# Patient Record
Sex: Female | Born: 2000 | Race: Black or African American | Hispanic: No | Marital: Single | Smoking: Former smoker
Health system: Southern US, Community
[De-identification: ages and names within clinical notes are randomized; demographics above are authoritative.]

## PROBLEM LIST (undated history)

## (undated) DIAGNOSIS — B999 Unspecified infectious disease: Secondary | ICD-10-CM

## (undated) DIAGNOSIS — F329 Major depressive disorder, single episode, unspecified: Secondary | ICD-10-CM

## (undated) DIAGNOSIS — J45909 Unspecified asthma, uncomplicated: Secondary | ICD-10-CM

## (undated) DIAGNOSIS — F32A Depression, unspecified: Secondary | ICD-10-CM

## (undated) HISTORY — DX: Depression, unspecified: F32.A

## (undated) HISTORY — PX: NO PAST SURGERIES: SHX2092

---
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
## 2018-12-03 ENCOUNTER — Inpatient Hospital Stay (HOSPITAL_COMMUNITY)
Admission: AD | Admit: 2018-12-03 | Discharge: 2018-12-03 | Disposition: A | Discharge status: Home / Self Care | Payer: Medicaid Other | Attending: Obstetrics and Gynecology | Admitting: Obstetrics and Gynecology

## 2018-12-03 ENCOUNTER — Inpatient Hospital Stay (HOSPITAL_COMMUNITY): Payer: Medicaid Other

## 2018-12-03 ENCOUNTER — Encounter (HOSPITAL_COMMUNITY): Payer: Self-pay | Admitting: *Deleted

## 2018-12-03 ENCOUNTER — Other Ambulatory Visit: Payer: Self-pay

## 2018-12-03 DIAGNOSIS — O99511 Diseases of the respiratory system complicating pregnancy, first trimester: Secondary | ICD-10-CM | POA: Insufficient documentation

## 2018-12-03 DIAGNOSIS — O26892 Other specified pregnancy related conditions, second trimester: Secondary | ICD-10-CM | POA: Diagnosis present

## 2018-12-03 DIAGNOSIS — J45909 Unspecified asthma, uncomplicated: Secondary | ICD-10-CM | POA: Diagnosis not present

## 2018-12-03 DIAGNOSIS — Z3491 Encounter for supervision of normal pregnancy, unspecified, first trimester: Secondary | ICD-10-CM

## 2018-12-03 DIAGNOSIS — N760 Acute vaginitis: Secondary | ICD-10-CM | POA: Diagnosis present

## 2018-12-03 DIAGNOSIS — B9689 Other specified bacterial agents as the cause of diseases classified elsewhere: Secondary | ICD-10-CM | POA: Diagnosis not present

## 2018-12-03 DIAGNOSIS — Z3A10 10 weeks gestation of pregnancy: Secondary | ICD-10-CM | POA: Diagnosis not present

## 2018-12-03 DIAGNOSIS — R109 Unspecified abdominal pain: Secondary | ICD-10-CM | POA: Insufficient documentation

## 2018-12-03 DIAGNOSIS — O26891 Other specified pregnancy related conditions, first trimester: Secondary | ICD-10-CM

## 2018-12-03 DIAGNOSIS — O210 Mild hyperemesis gravidarum: Secondary | ICD-10-CM | POA: Diagnosis not present

## 2018-12-03 DIAGNOSIS — O3680X0 Pregnancy with inconclusive fetal viability, not applicable or unspecified: Secondary | ICD-10-CM | POA: Diagnosis not present

## 2018-12-03 DIAGNOSIS — Z87891 Personal history of nicotine dependence: Secondary | ICD-10-CM | POA: Diagnosis not present

## 2018-12-03 DIAGNOSIS — Z3A01 Less than 8 weeks gestation of pregnancy: Secondary | ICD-10-CM

## 2018-12-03 DIAGNOSIS — Z3490 Encounter for supervision of normal pregnancy, unspecified, unspecified trimester: Secondary | ICD-10-CM

## 2018-12-03 DIAGNOSIS — O23591 Infection of other part of genital tract in pregnancy, first trimester: Secondary | ICD-10-CM | POA: Diagnosis not present

## 2018-12-03 HISTORY — DX: Unspecified asthma, uncomplicated: J45.909

## 2018-12-03 LAB — CBC
HCT: 40.9 % (ref 36.0–46.0)
Hemoglobin: 14.1 g/dL (ref 12.0–15.0)
MCH: 30.6 pg (ref 26.0–34.0)
MCHC: 34.5 g/dL (ref 30.0–36.0)
MCV: 88.7 fL (ref 80.0–100.0)
Platelets: 281 10*3/uL (ref 150–400)
RBC: 4.61 MIL/uL (ref 3.87–5.11)
RDW: 12.5 % (ref 11.5–15.5)
WBC: 7.1 10*3/uL (ref 4.0–10.5)
nRBC: 0 % (ref 0.0–0.2)

## 2018-12-03 LAB — ABO/RH: ABO/RH(D): A NEG

## 2018-12-03 LAB — URINALYSIS, ROUTINE W REFLEX MICROSCOPIC
Bilirubin Urine: NEGATIVE
Glucose, UA: NEGATIVE mg/dL
Hgb urine dipstick: NEGATIVE
Ketones, ur: NEGATIVE mg/dL
Leukocytes,Ua: NEGATIVE
Nitrite: NEGATIVE
Protein, ur: NEGATIVE mg/dL
Specific Gravity, Urine: 1.004 — ABNORMAL LOW (ref 1.005–1.030)
pH: 6 (ref 5.0–8.0)

## 2018-12-03 LAB — WET PREP, GENITAL
Sperm: NONE SEEN
Trich, Wet Prep: NONE SEEN
Yeast Wet Prep HPF POC: NONE SEEN

## 2018-12-03 LAB — TYPE AND SCREEN
ABO/RH(D): A NEG
Antibody Screen: NEGATIVE

## 2018-12-03 LAB — HCG, QUANTITATIVE, PREGNANCY: hCG, Beta Chain, Quant, S: 2275 m[IU]/mL — ABNORMAL HIGH (ref <5)

## 2018-12-03 MED ORDER — PRENATAL ADULT GUMMY/DHA/FA 0.4-25 MG PO CHEW: 1.0000 | CHEWABLE_TABLET | Freq: Every day | ORAL | 12 refills | Status: AC

## 2018-12-03 MED ORDER — OMEPRAZOLE 20 MG PO CPDR: 20.0000 mg | DELAYED_RELEASE_CAPSULE | Freq: Every day | ORAL | 3 refills | Status: DC

## 2018-12-03 MED ORDER — PROMETHAZINE HCL 25 MG PO TABS: 25.0000 mg | ORAL_TABLET | Freq: Four times a day (QID) | ORAL | 3 refills | Status: DC | PRN

## 2018-12-03 MED ORDER — METRONIDAZOLE 0.75 % VA GEL: 1.0000 | Freq: Every day | VAGINAL | 0 refills | Status: AC

## 2018-12-03 NOTE — MAU Provider Note (Signed)
History     CSN: [id number]  Arrival date and time: 12/03/18 1008   First Provider Initiated Contact with Patient 12/03/18 1122      Chief Complaint  Patient presents with  . Abdominal Pain   HPI  Ms.  Emily Welch is a 18 y.o. year old G1P0 female at 269w2d weeks gestation by LMP who presents to MAU reporting mild abdominal cramping that started 11/18/18. She reports the cramping was "stronger when it started" on 11/18/18, but is now mild. She had (+) HPT x 2 and (+) UPT at [facility] (has paperwork with her today) all on 11/30/2018. She does report some mild N/V.  She does not have a regular GYN and [facility]; requesting a list.  Past Medical History:  Diagnosis Date  . Asthma     History reviewed. No pertinent surgical history.  History reviewed. No pertinent family history.  Social History   Tobacco Use  . Smoking status: Former Games developermoker  . Smokeless tobacco: Former Engineer, waterUser  Substance Use Topics  . Alcohol use: Not Currently  . Drug use: Not Currently    Types: Marijuana    Comment: stopped when found out she was pregnant    Allergies: No Known Allergies  No medications prior to admission.    Review of Systems  Constitutional: Negative.   HENT: Negative.   Eyes: Negative.   Respiratory: Negative.   Cardiovascular: Negative.   Gastrointestinal: Positive for nausea.  Endocrine: Negative.   Genitourinary: Positive for pelvic pain (cramping).  Musculoskeletal: Negative.   Skin: Negative.   Allergic/Immunologic: Negative.   Neurological: Negative.   Hematological: Negative.   Psychiatric/Behavioral: Negative.    Physical Exam   Blood pressure 124/61, pulse 83, temperature 98.3 F (36.8 C), temperature source Oral, resp. rate 17, height 5' (1.524 m), weight 65.7 kg, last menstrual period 10/20/2018, SpO2 100 %.  Physical Exam  Nursing note and vitals reviewed. Constitutional: She is oriented to person, place, and time. She  appears well-developed and well-nourished.  HENT:  Head: Normocephalic and atraumatic.  Eyes: Pupils are equal, round, and reactive to light.  Neck: Normal range of motion.  Cardiovascular: Normal rate.  Respiratory: Effort normal.  GI: Soft.  Genitourinary:    Genitourinary Comments: Uterus: non-tender, SE: cervix is smooth, pink, no lesions, small amt of thick, white vaginal d/c -- WP, GC/CT done, closed/long/firm, no CMT or friability, no adnexal tenderness    Musculoskeletal: Normal range of motion.  Neurological: She is alert and oriented to person, place, and time. She has normal reflexes.  Skin: Skin is warm and dry.  Psychiatric: She has a normal mood and affect. Her behavior is normal. Judgment and thought content normal.    MAU Course  Procedures  MDM CCUA UPT CBC ABO/Rh HCG Wet Prep GC/CT -- pending HIV -- pending OB < 14 wks US with TV  Results for orders placed or performed during the hospital encounter of 12/03/18 (from the past 24 hour(s))  CBC     Status: None   Collection Time: 12/03/18 10:33 AM  Result Value Ref Range   WBC 7.1 4.0 - 10.5 K/uL   RBC 4.61 3.87 - 5.11 MIL/uL   Hemoglobin 14.1 12.0 - 15.0 g/dL   HCT 78.240.9 95.636.0 - 21.346.0 %   MCV 88.7 80.0 - 100.0 fL   MCH 30.6 26.0 - 34.0 pg   MCHC 34.5 30.0 - 36.0 g/dL   RDW 08.612.5 57.811.5 - 46.915.5 %  Platelets 281 150 - 400 K/uL   nRBC 0.0 0.0 - 0.2 %  Type and screen Tok     Status: None   Collection Time: 12/03/18 10:33 AM  Result Value Ref Range   ABO/RH(D) A NEG    Antibody Screen NEG    Sample Expiration      12/06/2018,2359 Performed at [facility], [place] [address], [place], [place] [address]   hCG, quantitative, pregnancy     Status: Abnormal   Collection Time: 12/03/18 10:33 AM  Result Value Ref Range   hCG, Beta Chain, Quant, S 2,275 (H) <5 mIU/mL  Urinalysis, Routine w reflex microscopic     Status: Abnormal   Collection Time: 12/03/18 10:35 AM  Result Value Ref  Range   Color, Urine STRAW (A) YELLOW   APPearance CLEAR CLEAR   Specific Gravity, Urine 1.004 (L) 1.005 - 1.030   pH 6.0 5.0 - 8.0   Glucose, UA NEGATIVE NEGATIVE mg/dL   Hgb urine dipstick NEGATIVE NEGATIVE   Bilirubin Urine NEGATIVE NEGATIVE   Ketones, ur NEGATIVE NEGATIVE mg/dL   Protein, ur NEGATIVE NEGATIVE mg/dL   Nitrite NEGATIVE NEGATIVE   Leukocytes,Ua NEGATIVE NEGATIVE  ABO/Rh     Status: None   Collection Time: 12/03/18 10:35 AM  Result Value Ref Range   ABO/RH(D)      A NEG Performed at [facility] [address], [place],  [address]   Wet prep, genital     Status: Abnormal   Collection Time: 12/03/18 11:44 AM  Result Value Ref Range   Yeast Wet Prep HPF POC NONE SEEN NONE SEEN   Trich, Wet Prep NONE SEEN NONE SEEN   Clue Cells Wet Prep HPF POC PRESENT (A) NONE SEEN   WBC, Wet Prep HPF POC FEW (A) NONE SEEN   Sperm NONE SEEN     US Ob Less Than 14 Weeks With Ob Transvaginal  Result Date: 12/03/2018 CLINICAL DATA:  Abdominal pain during pregnancy in first trimester. EXAM: OBSTETRIC <14 WK [place] AND TRANSVAGINAL OB US TECHNIQUE: Both transabdominal and transvaginal ultrasound examinations were performed for complete evaluation of the gestation as well as the maternal uterus, adnexal regions, and pelvic cul-de-sac. Transvaginal technique was performed to assess early pregnancy. COMPARISON:  None. FINDINGS: Intrauterine gestational sac: Single Yolk sac:  Visualized. Embryo:  Not Visualized. Cardiac Activity: Not Visualized. MSD: 5.09 mm   5 w   2 d Subchorionic hemorrhage:  None visualized. Maternal uterus/adnexae: Probable corpus luteum cyst is seen in left ovary. No free fluid is noted. IMPRESSION: Probable early intrauterine gestational sac with yolk sac, but no fetal pole or cardiac activity yet visualized. Recommend follow-up quantitative B-HCG levels and follow-up US in 14 days to assess viability. This recommendation follows SRU consensus guidelines:  Diagnostic Criteria for Nonviable Pregnancy Early in the First Trimester. [place] [employer] Med 2013; 564:3329-51. Electronically Signed   By: Marijo Conception M.D.   On: 12/03/2018 13:03     Assessment and Plan  1) Intrauterine pregnancy - Advised to start [facility] @ [redacted] wks gestation - Information for [employer] given - Information provided on 1st trimester pregnancy - Rx for PNV chewable po daily  2) Abdominal pain during pregnancy in first trimester - Information provided on abdominal pain in pregnancy and safe medication in pregnancy - Advised to take Tylenol 1000 mg every 6 hours prn pain  3) Pregnancy with uncertain fetal viability, single or unspecified fetus   -  F/U ultrasound for viability in 2 wks at [facility] - Reassurance given that baby may not have been seen d/t very young gestation  4) Morning sickness  - Information provided on morning sickness - Rx for Phenergan 25 mg every 6 hours prn   5) Bacterial vaginosis  - Information provided on BV - Rx for Metrogel 1 applicatorful hs x 5 days  - Discharge patient - Anticipate a call to schedule U/S in 2 wks - Message sent to [facility] and [facility] to get patient scheduled accordingly - Patient verbalized an understanding of the plan of care and agrees.     Raelyn Mora, MSN, CNM 12/03/2018, 11:22 AM

## 2018-12-03 NOTE — MAU Note (Signed)
Just having mild cramping. +HPT x2. Has been to [facility] on Friday for confirmation. Has paperwork with her.

## 2018-12-03 NOTE — Discharge Instructions (Signed)

## 2018-12-03 NOTE — MAU Note (Signed)
Came out of rm, states getting pains in LUQ now.

## 2018-12-04 LAB — GC/CHLAMYDIA PROBE AMP (~~LOC~~) NOT AT ARMC
Chlamydia: POSITIVE — AB
Comment: NEGATIVE
Comment: NORMAL
Neisseria Gonorrhea: NEGATIVE

## 2018-12-05 ENCOUNTER — Telehealth: Payer: Self-pay | Admitting: Obstetrics and Gynecology

## 2018-12-05 ENCOUNTER — Encounter: Payer: Self-pay | Admitting: *Deleted

## 2018-12-05 ENCOUNTER — Other Ambulatory Visit: Payer: Self-pay | Admitting: Obstetrics and Gynecology

## 2018-12-05 DIAGNOSIS — O98311 Other infections with a predominantly sexual mode of transmission complicating pregnancy, first trimester: Secondary | ICD-10-CM

## 2018-12-05 DIAGNOSIS — A568 Sexually transmitted chlamydial infection of other sites: Secondary | ICD-10-CM

## 2018-12-05 MED ORDER — AZITHROMYCIN 500 MG PO TABS: 1000.0000 mg | ORAL_TABLET | Freq: Once | ORAL | 1 refills | Status: AC

## 2018-12-05 NOTE — Progress Notes (Signed)
Received STD report and noted Emily Welch + chlamydia. Per chart was notified and treated by Rolitta Dawson,CNM. STD report completed and faxed to health department. Linda,RN

## 2018-12-05 NOTE — Telephone Encounter (Signed)
TC to patient to notify her of (+) CT results and the need for abx Rx for her partner will be sent to pharmacy on file. Emphasized the importance of taking the abx and getting treated early to prevent complications (as severe a pregnancy loss) from occurring. Patient verbalized an understanding of the plan of care and agrees.   Laury Deep, CNM

## 2018-12-05 NOTE — Progress Notes (Signed)
Rx for Azithromycin 1000 mg po with 1 refill for expedited partner treatment. Patient made aware of the purpose for the refill. Also notified to abstain from unprotected sex with partner until 7-14 days after both of them have been treated. Patient verbalized an understanding of the plan of care and agrees.   Laury Deep, CNM

## 2018-12-06 ENCOUNTER — Inpatient Hospital Stay: Admission: RE | Admit: 2018-12-06 | Payer: Medicaid Other | Source: Ambulatory Visit

## 2018-12-10 ENCOUNTER — Telehealth: Payer: Self-pay | Admitting: General Practice

## 2018-12-10 NOTE — Telephone Encounter (Signed)
Patient called spoke with after hours team stating that she need resources in regards to mental health issues.  Called patient, but no answer.  Left message on VM with Behavioral Health Outpatient service number [phone]).  Asked patient to give our office a call with any questions.

## 2018-12-17 ENCOUNTER — Ambulatory Visit (HOSPITAL_COMMUNITY)
Admission: RE | Admit: 2018-12-17 | Discharge: 2018-12-17 | Disposition: A | Discharge status: Home / Self Care | Payer: Medicaid Other | Source: Ambulatory Visit | Attending: Obstetrics and Gynecology | Admitting: Obstetrics and Gynecology

## 2018-12-17 ENCOUNTER — Other Ambulatory Visit: Payer: Self-pay

## 2018-12-17 ENCOUNTER — Ambulatory Visit (INDEPENDENT_AMBULATORY_CARE_PROVIDER_SITE_OTHER): Payer: Medicaid Other

## 2018-12-17 ENCOUNTER — Encounter: Payer: Self-pay | Admitting: Family Medicine

## 2018-12-17 DIAGNOSIS — O3680X0 Pregnancy with inconclusive fetal viability, not applicable or unspecified: Secondary | ICD-10-CM | POA: Diagnosis not present

## 2018-12-17 NOTE — Progress Notes (Signed)
Chart reviewed - agree with RN documentation.   

## 2018-12-17 NOTE — Progress Notes (Signed)
Pt here today for OB US results.  Dr. Nehemiah Settle notified.  Provider recommendation to start OB care.   Pt notified that she has a viable pregnancy with EDD 08/06/2019, she is 6w 6d today, and FHR 138 bpm.  Pt denies vaginal bleeding but is having some menstrual like cramps.  I explained to the pt that it is normal.  List of medicines safe to take in pregnancy given to pt.  Proof of pregnancy letter provided by the front office.  Frances Nickels 12/17/18

## 2019-01-08 ENCOUNTER — Other Ambulatory Visit: Payer: Self-pay

## 2019-01-08 ENCOUNTER — Ambulatory Visit (INDEPENDENT_AMBULATORY_CARE_PROVIDER_SITE_OTHER): Payer: Medicaid Other | Admitting: *Deleted

## 2019-01-08 VITALS — Wt 138.0 lb

## 2019-01-08 DIAGNOSIS — Z3401 Encounter for supervision of normal first pregnancy, first trimester: Secondary | ICD-10-CM

## 2019-01-08 DIAGNOSIS — Z3400 Encounter for supervision of normal first pregnancy, unspecified trimester: Secondary | ICD-10-CM

## 2019-01-08 DIAGNOSIS — O0990 Supervision of high risk pregnancy, unspecified, unspecified trimester: Secondary | ICD-10-CM | POA: Diagnosis not present

## 2019-01-08 DIAGNOSIS — Z3A10 10 weeks gestation of pregnancy: Secondary | ICD-10-CM

## 2019-01-08 MED ORDER — FAMOTIDINE 20 MG PO TABS: 20.0000 mg | ORAL_TABLET | Freq: Two times a day (BID) | ORAL | 1 refills | Status: DC

## 2019-01-08 MED ORDER — BLOOD PRESSURE MONITOR AUTOMAT DEVI: 1.0000 | Freq: Every day | 0 refills | Status: AC

## 2019-01-08 MED ORDER — DICLEGIS 10-10 MG PO TBEC: 1.0000 | DELAYED_RELEASE_TABLET | Freq: Every evening | ORAL | 2 refills | Status: DC | PRN

## 2019-01-08 NOTE — Progress Notes (Signed)
  Virtual Visit via Telephone Note  I connected with Emily Welch on 01/08/19 at  1:30 PM EST by telephone and verified that I am speaking with the correct person using two identifiers.  Location: Patient: Emily Welch MRN: [id number] Provider: Derl Barrow, RN   I discussed the limitations, risks, security and privacy concerns of performing an evaluation and management service by telephone and the availability of in person appointments. I also discussed with the patient that there may be a patient responsible charge related to this service. The patient expressed understanding and agreed to proceed.   History of Present Illness: PRENATAL INTAKE SUMMARY  Emily Welch presents today New OB Nurse Interview.  OB History    Gravida  1   Para      Term      Preterm      AB      Living        SAB      TAB      Ectopic      Multiple      Live Births             I have reviewed the patient's medical, obstetrical, social, and family histories, medications, and available lab results.  SUBJECTIVE She complains of nausea and heart burn.   Observations/Objective: Initial nurse interview for history/labs (New OB)  EDD: 08/06/2019 by early ultrasound GA: [redacted]w[redacted]d G1P0 FHT: non face to face interview  GENERAL APPEARANCE: non face to face interview  Assessment and Plan: Normal pregnancy Prenatal care-[facility] Labs to be completed at next visit with midwife Rx for Pepcid 20 mg and Diglegis sent to pharmacy Rx for blood pressure cuff sent to pharmacy Continue PNV Sign up for Babyscripts TOC 02/01/2019  Follow Up Instructions:   I discussed the assessment and treatment plan with the patient. The patient was provided an opportunity to ask questions and all were answered. The patient agreed with the plan and demonstrated an understanding of the instructions.   The patient was advised to call back or seek an in-person evaluation if the symptoms worsen or  if the condition fails to improve as anticipated.  I provided 15 minutes of non-face-to-face time during this encounter.   Derl Barrow, RN

## 2019-01-21 ENCOUNTER — Telehealth: Payer: Self-pay | Admitting: Emergency Medicine

## 2019-01-21 ENCOUNTER — Telehealth: Payer: Medicaid Other

## 2019-01-21 NOTE — Telephone Encounter (Signed)
Called pt regarding VV today at 6pm, pt states she had all her questions answered by her PCP.

## 2019-01-21 NOTE — Telephone Encounter (Signed)
Chart review.

## 2019-01-22 DIAGNOSIS — O26891 Other specified pregnancy related conditions, first trimester: Secondary | ICD-10-CM | POA: Diagnosis not present

## 2019-01-22 DIAGNOSIS — O200 Threatened abortion: Secondary | ICD-10-CM | POA: Diagnosis not present

## 2019-01-22 DIAGNOSIS — Z3A12 12 weeks gestation of pregnancy: Secondary | ICD-10-CM | POA: Diagnosis not present

## 2019-01-22 DIAGNOSIS — O0990 Supervision of high risk pregnancy, unspecified, unspecified trimester: Secondary | ICD-10-CM | POA: Diagnosis not present

## 2019-02-01 ENCOUNTER — Encounter: Payer: Self-pay | Admitting: General Practice

## 2019-02-01 ENCOUNTER — Other Ambulatory Visit: Payer: Self-pay

## 2019-02-01 ENCOUNTER — Other Ambulatory Visit (HOSPITAL_COMMUNITY)
Admission: RE | Admit: 2019-02-01 | Discharge: 2019-02-01 | Disposition: A | Discharge status: Home / Self Care | Payer: Medicaid Other | Source: Ambulatory Visit

## 2019-02-01 ENCOUNTER — Ambulatory Visit (INDEPENDENT_AMBULATORY_CARE_PROVIDER_SITE_OTHER): Payer: Medicaid Other

## 2019-02-01 VITALS — BP 115/75 | HR 90 | Temp 98.1°F | Wt 138.2 lb

## 2019-02-01 DIAGNOSIS — O210 Mild hyperemesis gravidarum: Secondary | ICD-10-CM | POA: Diagnosis not present

## 2019-02-01 DIAGNOSIS — O98311 Other infections with a predominantly sexual mode of transmission complicating pregnancy, first trimester: Secondary | ICD-10-CM

## 2019-02-01 DIAGNOSIS — A568 Sexually transmitted chlamydial infection of other sites: Secondary | ICD-10-CM

## 2019-02-01 DIAGNOSIS — Z23 Encounter for immunization: Secondary | ICD-10-CM | POA: Diagnosis not present

## 2019-02-01 DIAGNOSIS — Z3400 Encounter for supervision of normal first pregnancy, unspecified trimester: Secondary | ICD-10-CM

## 2019-02-01 DIAGNOSIS — Z3482 Encounter for supervision of other normal pregnancy, second trimester: Secondary | ICD-10-CM | POA: Diagnosis not present

## 2019-02-01 DIAGNOSIS — F1290 Cannabis use, unspecified, uncomplicated: Secondary | ICD-10-CM

## 2019-02-01 DIAGNOSIS — Z3A13 13 weeks gestation of pregnancy: Secondary | ICD-10-CM

## 2019-02-01 DIAGNOSIS — O99321 Drug use complicating pregnancy, first trimester: Secondary | ICD-10-CM

## 2019-02-01 NOTE — Patient Instructions (Signed)

## 2019-02-01 NOTE — Progress Notes (Signed)
Subjective:   Emily Welch is a 19 y.o. G1P0 at [redacted]w[redacted]d by early ultrasound being seen today for her first obstetrical visit.  She has no obstetrical history, however has a medical history significant for asthma. Patient does intend to breast feed. Pregnancy history fully reviewed. She desires a Nexplanon for birth control and has used this in the past.   Emily Welch states the FOB, Emily Welch, is aware and involved in the pregnancy.  She endorses a history of CT and was treated.  However, she is unsure of whether or not her partner was treated stating that when she asked he told her he had to reschedule his appt.   Patient reports headache.  She endorses a history of migraines and states her headaches are now "more intense."  She does not know how often she is having the headaches and states she does not take medication when they occur.  Patient does report some nausea in conjunction with her headaches.  She reports that sleeping causes resolution of the headaches.   Patient denies issues with urination, but reports some constipation.  She states she had a bowel movement 2 days ago that was not hard to pass.  She denies sexual activity in the past 3 days. She does report some white discharge and states she did not complete her treatment for BV back in November. She denies odor or vaginal bleeding.   Patient endorses a history of marijuana usage and states she used 2 weeks ago.     HISTORY: OB History  Gravida Para Term Preterm AB Living  1 0 0 0 0 0  SAB TAB Ectopic Multiple Live Births  0 0 0 0 0    # Outcome Date GA Lbr Len/2nd Weight Sex Delivery Anes PTL Lv  1 Current             No pap smear history d/t age.  Past Medical History:  Diagnosis Date  . Asthma   . Depression    Past Surgical History:  Procedure Laterality Date  . NO PAST SURGERIES     Family History  Problem Relation Age of Onset  . Heart disease Father   . Diabetes Sister    Social History   Tobacco  Use  . Smoking status: Former Games developer  . Smokeless tobacco: Former Engineer, water Use Topics  . Alcohol use: Not Currently  . Drug use: Not Currently    Types: Marijuana    Comment: stopped when found out she was pregnant   No Known Allergies Current Outpatient Medications on File Prior to Visit  Medication Sig Dispense Refill  . Blood Pressure Monitoring (BLOOD PRESSURE MONITOR AUTOMAT) DEVI 1 Device by Does not apply route daily. Automatic Blood pressure cuff regular or large size. Monitor blood pressure regularly at home. ICD-10 Code: O09.90 1 each 0  . DICLEGIS 10-10 MG TBEC Take 1 tablet by mouth at bedtime as needed. 60 tablet 2  . famotidine (PEPCID) 20 MG tablet Take 1 tablet (20 mg total) by mouth 2 (two) times daily. 60 tablet 1  . omeprazole (PRILOSEC) 20 MG capsule Take 1 capsule (20 mg total) by mouth daily. (Patient not taking: Reported on 01/08/2019) 30 capsule 3  . Prenatal MV & Min w/FA-DHA (PRENATAL ADULT GUMMY/DHA/FA) 0.4-25 MG CHEW Chew 1 tablet by mouth daily. 30 tablet 12  . promethazine (PHENERGAN) 25 MG tablet Take 1 tablet (25 mg total) by mouth every 6 (six) hours as needed for nausea or vomiting. (  Patient not taking: Reported on 01/08/2019) 30 tablet 3   No current facility-administered medications on file prior to visit.    Review of Systems Pertinent items noted in HPI and remainder of comprehensive ROS otherwise negative.  Exam   Vitals:   02/01/19 0936  BP: 115/75  Pulse: 90  Temp: 98.1 F (36.7 C)  Weight: 138 lb 3.2 oz (62.7 kg)   Fetal Heart Rate (bpm): 156  Physical Exam  Constitutional: She is oriented to person, place, and time and well-developed, well-nourished, and in no distress. No distress.  HENT:  Head: Normocephalic and atraumatic.  Eyes: Conjunctivae are normal.  Neck: No tracheal tenderness present. No tracheal deviation present. No thyroid mass and no thyromegaly present.  Cardiovascular: Normal rate, regular rhythm and normal  heart sounds.  Pulmonary/Chest: Effort normal and breath sounds normal. Right breast exhibits no mass, no nipple discharge and no tenderness. Left breast exhibits no mass, no nipple discharge and no tenderness.  Abdominal: Soft. Bowel sounds are normal. There is no abdominal tenderness.  Genitourinary:    Cervix normal.  Uterus is enlarged. Cervix exhibits no tenderness.    Vaginal discharge present.     No lesions in the vagina.     Creamy  odorless and white found.     Genitourinary Comments: Uterus size c/w 12-[redacted]wk GA CV collected   Musculoskeletal:        General: No edema. Normal range of motion.     Cervical back: Normal range of motion.  Neurological: She is alert and oriented to person, place, and time.  Skin: Skin is warm and dry.  Psychiatric: Affect and judgment normal.    156 Doppler for FHR check:  Assessment:   Pregnancy: G1P0 Patient Active Problem List   Diagnosis Date Noted  . Supervision of normal first pregnancy, antepartum 01/08/2019  . Bacterial vaginosis 12/03/2018  . Intrauterine pregnancy 12/03/2018  . Pregnancy with uncertain fetal viability 12/03/2018  . Morning sickness 12/03/2018     Plan:  1. Supervision of normal first pregnancy, antepartum -Congratulations given and patient welcomed to practice. -Discussed how virtual visits are utilized for PN visits in midst of coronavirus as a means of reducing exposure for staff and patients.    -Encouraged to seek out care at office or emergency room for urgent and/or emergent concerns. -Educated on the nature of [facility] with multiple MDs and other Advanced Practice Providers was explained to patient; also emphasized that residents, students are part of our team. Informed of her right to refuse care as she deems appropriate.  -No questions or concerns.  -Anticipatory guidance for prenatal visits including labs, ultrasounds, and testing. -Encouraged to utilize [place] for her ability to review results, send requests, and have questions addressed.  -Discussed due date based on Early [place].  -Influenza offered, accepted, and given -Plans to breastfeed and utilize Nexplanon for PP BC method. -Desires circumcision if a female child.   - ob urine culture - Obstetric Panel, Including HIV - Genetic Screening - Cervicovaginal ancillary only( [place]) - HgB A1c - Glucose  2. Morning sickness -Reassured that morning sickness and food aversions are common during pregnancy. -Discussed ways to decrease symptoms including small frequent meals throughout the day.  3. Chlamydia trachomatis infection in mother during first trimester of pregnancy -Informed that it is possible that she was reinfected if FOB was not properly treated. -Given script for EPT: Alferd Patee DOB 10/14/1989 -Instructed to keep script until  her results return and if positive to give to partner so treatment can be completed together.  However, if negative she can dispose of script. -Patient verbalized understanding.  -TOC collected today.  4. Marijuana use -Discussed effects of marijuana usage on pregnancy and fetus including hyperemesis and fetal growth restrictions. -Encouraged to continue cessation.    Initial labs drawn. Continue prenatal vitamins. Genetic Screening discussed, First trimester screen, Quad screen and NIPS: ordered. Ultrasound discussed; fetal anatomic survey: ordered. Problem list reviewed and updated. The nature of [facility] with multiple MDs and other Advanced Practice Providers was explained to patient; also emphasized that residents, students are part of our team. Routine obstetric precautions reviewed. Return in about 4 weeks (around 03/01/2019) for LR-ROB via Virtual Visit.     Cherre Robins, CNM 02/01/2019 9:54 AM

## 2019-02-03 LAB — OBSTETRIC PANEL, INCLUDING HIV
Antibody Screen: NEGATIVE
Basophils Absolute: 0 10*3/uL (ref 0.0–0.2)
Basos: 0 %
EOS (ABSOLUTE): 0.1 10*3/uL (ref 0.0–0.4)
Eos: 1 %
HIV Screen 4th Generation wRfx: NONREACTIVE
Hematocrit: 42.3 % (ref 34.0–46.6)
Hemoglobin: 14.4 g/dL (ref 11.1–15.9)
Hepatitis B Surface Ag: NEGATIVE
Immature Grans (Abs): 0 10*3/uL (ref 0.0–0.1)
Immature Granulocytes: 0 %
Lymphocytes Absolute: 3.2 10*3/uL — ABNORMAL HIGH (ref 0.7–3.1)
Lymphs: 53 %
MCH: 30.5 pg (ref 26.6–33.0)
MCHC: 34 g/dL (ref 31.5–35.7)
MCV: 90 fL (ref 79–97)
Monocytes Absolute: 0.3 10*3/uL (ref 0.1–0.9)
Monocytes: 6 %
Neutrophils Absolute: 2.5 10*3/uL (ref 1.4–7.0)
Neutrophils: 40 %
Platelets: 287 10*3/uL (ref 150–450)
RBC: 4.72 x10E6/uL (ref 3.77–5.28)
RDW: 12.2 % (ref 11.7–15.4)
RPR Ser Ql: NONREACTIVE
Rh Factor: NEGATIVE
Rubella Antibodies, IGG: 5.99 index (ref >0.99)
WBC: 6.1 10*3/uL (ref 3.4–10.8)

## 2019-02-03 LAB — CULTURE, OB URINE

## 2019-02-03 LAB — GLUCOSE, RANDOM: Glucose: 78 mg/dL (ref 65–99)

## 2019-02-03 LAB — HEMOGLOBIN A1C
Est. average glucose Bld gHb Est-mCnc: 91 mg/dL
Hgb A1c MFr Bld: 4.8 % (ref 4.8–5.6)

## 2019-02-03 LAB — URINE CULTURE, OB REFLEX

## 2019-02-04 LAB — CERVICOVAGINAL ANCILLARY ONLY
Bacterial Vaginitis (gardnerella): NEGATIVE
Candida Glabrata: NEGATIVE
Candida Vaginitis: NEGATIVE
Chlamydia: POSITIVE — AB
Comment: NEGATIVE
Comment: NEGATIVE
Comment: NEGATIVE
Comment: NEGATIVE
Comment: NEGATIVE
Comment: NORMAL
Neisseria Gonorrhea: NEGATIVE
Trichomonas: NEGATIVE

## 2019-02-05 ENCOUNTER — Other Ambulatory Visit: Payer: Self-pay

## 2019-02-05 ENCOUNTER — Encounter: Payer: Self-pay | Admitting: General Practice

## 2019-02-05 DIAGNOSIS — O0990 Supervision of high risk pregnancy, unspecified, unspecified trimester: Secondary | ICD-10-CM | POA: Diagnosis not present

## 2019-02-05 DIAGNOSIS — A749 Chlamydial infection, unspecified: Secondary | ICD-10-CM | POA: Insufficient documentation

## 2019-02-05 DIAGNOSIS — A568 Sexually transmitted chlamydial infection of other sites: Secondary | ICD-10-CM

## 2019-02-05 DIAGNOSIS — O26899 Other specified pregnancy related conditions, unspecified trimester: Secondary | ICD-10-CM | POA: Insufficient documentation

## 2019-02-05 DIAGNOSIS — Z6791 Unspecified blood type, Rh negative: Secondary | ICD-10-CM | POA: Insufficient documentation

## 2019-02-05 MED ORDER — AZITHROMYCIN 500 MG PO TABS: 1000.0000 mg | ORAL_TABLET | Freq: Once | ORAL | 0 refills | Status: AC

## 2019-02-06 ENCOUNTER — Other Ambulatory Visit: Payer: Self-pay

## 2019-02-06 ENCOUNTER — Telehealth: Payer: Medicaid Other | Admitting: Licensed Clinical Social Worker

## 2019-02-06 DIAGNOSIS — F329 Major depressive disorder, single episode, unspecified: Secondary | ICD-10-CM

## 2019-02-07 NOTE — Progress Notes (Signed)
  02/07/2019  Emily Welch  [id number]  Number of Integrated Behavioral Health visits: 1/2  Session Start time: 4:00 Session End time: 4:15  Total time: 15 mins  Referring Provider: Dorisann Frames RN  Type of Visit: Video  Patient/Family location: Home  [facility] Provider location: [facility]  All persons participating in visit: yes  Confirmed patient's address: yes  Confirmed patient's phone number: yes  Any changes to demographics: no  Confirmed patient's insurance: no  Any changes to patient's insurance: no  Discussed confidentiality: no  I connected with Emily Welch and/or Emily Welch's by a video enabled telemedicine application and verified that I am speaking with the correct person using two identifiers.  I discussed the limitations of evaluation and management by telemedicine and the availability of in person appointments. I discussed that the purpose of this visit is to provide behavioral health care while limiting exposure to the novel coronavirus.  Discussed there is a possibility of technology failure and discussed alternative modes of communication if that failure occurs.  I discussed that engaging in this video visit, they consent to the provision of behavioral healthcare and the services will be billed under their insurance.  Patient and/or legal guardian expressed understanding and consented to video visit: yes  PRESENTING CONCERNS:  Patient reports the following symptoms/concerns: suicidal ideation, loss of interest, depressed  Duration of problem: Over two years Severity of problem: severe  STRENGTHS (Protective Factors/Coping Skills):  Patient is currently seeking assistance to further manage and cope with symptoms of depression  GOALS ADDRESSED:  Patient will:  1. Reduce symptoms of: Major den disorder  2. Increase ability of: Locating stable housing and employment  3. Demonstrate ability to: recognize triggers of depression and eliminate self defeating  behaviors INTERVENTIONS:  Interventions utilized: Solution Focus Therapy  Standardized Assessments completed: PHQ9  ASSESSMENT:  Patient currently experiencing major depression disorder  Patient may benefit from medication management and pyshotherapy  PLAN:  1. Follow up with behavioral health clinician on : 02/27/2019 2. Behavioral recommendations: Collaborative care with Dr. Daleen Bo  3. Referral(s): none at this time  I discussed the assessment and treatment plan with the patient and/or parent/guardian. They were provided an opportunity to ask questions and all were answered. They agreed with the plan and demonstrated an understanding of the instructions.  They were advised to call back or seek an in-person evaluation if the symptoms worsen or if the condition fails to improve as anticipated.  Gwyndolyn Saxon

## 2019-02-08 NOTE — Addendum Note (Signed)
Addended by: Gwyndolyn Saxon on: 02/08/2019 02:53 PM   Modules accepted: Level of Service

## 2019-02-11 ENCOUNTER — Encounter: Payer: Self-pay | Admitting: General Practice

## 2019-02-13 NOTE — Addendum Note (Signed)
Addended by: Hulda Marin C on: 02/13/2019 09:02 AM   Modules accepted: Orders

## 2019-02-27 ENCOUNTER — Ambulatory Visit: Payer: Medicaid Other | Admitting: Licensed Clinical Social Worker

## 2019-02-27 ENCOUNTER — Ambulatory Visit (INDEPENDENT_AMBULATORY_CARE_PROVIDER_SITE_OTHER): Payer: Medicaid Other

## 2019-02-27 ENCOUNTER — Other Ambulatory Visit (HOSPITAL_COMMUNITY)
Admission: RE | Admit: 2019-02-27 | Discharge: 2019-02-27 | Disposition: A | Discharge status: Home / Self Care | Payer: Medicaid Other | Source: Ambulatory Visit

## 2019-02-27 ENCOUNTER — Other Ambulatory Visit: Payer: Self-pay

## 2019-02-27 VITALS — BP 104/66 | HR 81 | Temp 98.3°F | Wt 145.0 lb

## 2019-02-27 DIAGNOSIS — Z113 Encounter for screening for infections with a predominantly sexual mode of transmission: Secondary | ICD-10-CM | POA: Insufficient documentation

## 2019-02-27 DIAGNOSIS — Z3A17 17 weeks gestation of pregnancy: Secondary | ICD-10-CM

## 2019-02-27 DIAGNOSIS — Z3400 Encounter for supervision of normal first pregnancy, unspecified trimester: Secondary | ICD-10-CM | POA: Diagnosis not present

## 2019-02-27 DIAGNOSIS — F329 Major depressive disorder, single episode, unspecified: Secondary | ICD-10-CM

## 2019-02-27 DIAGNOSIS — O98312 Other infections with a predominantly sexual mode of transmission complicating pregnancy, second trimester: Secondary | ICD-10-CM

## 2019-02-27 DIAGNOSIS — A568 Sexually transmitted chlamydial infection of other sites: Secondary | ICD-10-CM
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
## 2019-02-27 NOTE — Progress Notes (Signed)
   PRENATAL VISIT NOTE  Subjective:  Emily Welch is a 19 y.o. G1P0 at [redacted]w[redacted]d who presents today for routine prenatal care.  She is currently being monitored for supervision of a low-risk pregnancy with problems as listed below.  Patient is unsure of whether she is experiencing fetal movement and occasional abdominal cramping.  She reports continued vaginal discharge that is thick and white, but not of a large amount.  She reports some odor, but denies itching and burning. She endorses safety at home and denies SI/HI behaviors.  She endorses continued feelings of depression. Patient endorses good support at home and reports she stays with her older cousin and his wife.    Patient Active Problem List   Diagnosis Date Noted  . Rh negative status during pregnancy 02/05/2019  . Chlamydia infection affecting pregnancy in second trimester 02/05/2019  . Supervision of normal first pregnancy, antepartum 01/08/2019  . Bacterial vaginosis 12/03/2018  . Intrauterine pregnancy 12/03/2018  . Morning sickness 12/03/2018    The following portions of the patient's history were reviewed and updated as appropriate: allergies, current medications, past family history, past medical history, past social history, past surgical history and problem list. Problem list updated.  Objective:   Vitals:   02/27/19 1057  BP: 104/66  Pulse: 81  Temp: 98.3 F (36.8 C)  Weight: 145 lb (65.8 kg)    Fetal Status: Fetal Heart Rate (bpm): 156   Movement: Absent     General:  Alert, oriented and cooperative. Patient is in no acute distress.  Skin: Skin is warm and dry.   Cardiovascular: Regular rate and rhythm.  Respiratory: Normal respiratory effort. CTA-Bilaterally  Abdomen: Soft, gravid, appropriate for gestational age.  Pelvic: Cervical exam deferred        Extremities: Normal range of motion.  Edema: None  Mental Status: Normal mood and affect. Normal behavior. Normal judgment and thought content.    Assessment and Plan:  Pregnancy: G1P0 at [redacted]w[redacted]d  1. Supervision of normal first pregnancy, antepartum -Anticipatory guidance for upcoming appts. -Plan to follow up in 3 weeks in person for close monitoring of depressive symptoms and coordination of care with [facility].  2. Chlamydia trachomatis infection in mother during second trimester of pregnancy -TOC to be completed today.  Patient to self swab. -Patient states she has not had sexual intercourse since before completing prescription on Jan 14  3. Major depressive disorder with current active episode, unspecified depression episode severity, unspecified whether recurrent -Patient reports she was taking prozac and abilify prior to pregnancy, but stopped once her insurance went away at age 81.  -She reports she received care at [facility] prior to becoming pregnant. -Reports that she is coping well despite feelings of depression. -Patient remains alert and respectful during conversation, but affect is flat. -[facility]/SW Sue Lush to see patient with plan to coordinate care for psychiatric intervention.    Preterm labor symptoms and general obstetric precautions including but not limited to vaginal bleeding, contractions, leaking of fluid and fetal movement were reviewed with the patient.  Please refer to After Visit Summary for other counseling recommendations.  No follow-ups on file.  Future Appointments  Date Time Provider Department Center  02/27/2019 11:15 AM Gwyndolyn Saxon, LCSW [facility] None  03/18/2019  1:30 PM [facility] [place] 1 [facility]    Cherre Robins, CNM 02/27/2019, 11:02 AM

## 2019-02-27 NOTE — Patient Instructions (Signed)

## 2019-02-28 LAB — CERVICOVAGINAL ANCILLARY ONLY
Bacterial Vaginitis (gardnerella): NEGATIVE
Candida Glabrata: POSITIVE — AB
Candida Vaginitis: NEGATIVE
Chlamydia: NEGATIVE
Comment: NEGATIVE
Comment: NEGATIVE
Comment: NEGATIVE
Comment: NEGATIVE
Comment: NEGATIVE
Comment: NORMAL
Neisseria Gonorrhea: NEGATIVE
Trichomonas: NEGATIVE

## 2019-03-04 DIAGNOSIS — F4321 Adjustment disorder with depressed mood: Secondary | ICD-10-CM | POA: Diagnosis not present

## 2019-03-04 DIAGNOSIS — F411 Generalized anxiety disorder: Secondary | ICD-10-CM | POA: Diagnosis not present

## 2019-03-06 ENCOUNTER — Telehealth (HOSPITAL_COMMUNITY): Payer: Self-pay

## 2019-03-06 DIAGNOSIS — F329 Major depressive disorder, single episode, unspecified: Secondary | ICD-10-CM

## 2019-03-06 MED ORDER — FLUOXETINE HCL 40 MG PO CAPS: 40.0000 mg | ORAL_CAPSULE | Freq: Every day | ORAL | 2 refills | Status: DC

## 2019-03-06 NOTE — Telephone Encounter (Signed)
Emily Welch [id number] 10/22/2000   Patient called and verified her identity via birth date and last 4 of her SSN.  Patient agreeable to discuss questions regarding initiation of medications for her depression.  She states she was referred to [facility] and had her visit televisit on Monday Feb 8.  She states she was told that she needs to restart her medications, but the counseling center does not give medications.  Patient reports that her next appt is Wednesday Feb 17.  Patient endorses that she has taken Prozac in the past with good results.  She states that she has not had any thoughts of HI, but SI.  However, she does not have a plan and her cousin is at home with her.  She was instructed to call office or 911 if symptoms of depression or feelings of self-harm increase.  Patient to be started on Prozac 40mg  for daily use and to keep all scheduled appts.  Patient states she needs to reschedule her next appt due to work and patient informed that appt needs to be in person due to initiation of meds.  Patient verbalized understanding and had no further questions or concerns.  MSN, CNM Advanced Practice Provider, Center for [facility]  **This visit was completed, in its entirety, via telehealth communications.  I personally spent >/=7 minutes on the phone providing recommendations, education, and guidance.**

## 2019-03-18 ENCOUNTER — Ambulatory Visit (HOSPITAL_COMMUNITY)
Admission: RE | Admit: 2019-03-18 | Discharge: 2019-03-18 | Disposition: A | Discharge status: Home / Self Care | Payer: Medicaid Other | Source: Ambulatory Visit | Attending: Obstetrics and Gynecology | Admitting: Obstetrics and Gynecology

## 2019-03-18 ENCOUNTER — Other Ambulatory Visit: Payer: Self-pay

## 2019-03-18 DIAGNOSIS — O99342 Other mental disorders complicating pregnancy, second trimester: Secondary | ICD-10-CM

## 2019-03-18 DIAGNOSIS — Z363 Encounter for antenatal screening for malformations: Secondary | ICD-10-CM | POA: Diagnosis not present

## 2019-03-18 DIAGNOSIS — Z3400 Encounter for supervision of normal first pregnancy, unspecified trimester: Secondary | ICD-10-CM | POA: Insufficient documentation

## 2019-03-18 DIAGNOSIS — O360120 Maternal care for anti-D [Rh] antibodies, second trimester, not applicable or unspecified: Secondary | ICD-10-CM

## 2019-03-18 DIAGNOSIS — Z3A19 19 weeks gestation of pregnancy: Secondary | ICD-10-CM

## 2019-03-20 ENCOUNTER — Encounter: Payer: Medicaid Other | Admitting: Student

## 2019-03-20 ENCOUNTER — Ambulatory Visit: Payer: Medicaid Other | Admitting: Licensed Clinical Social Worker

## 2019-03-21 DIAGNOSIS — Z20822 Contact with and (suspected) exposure to covid-19: Secondary | ICD-10-CM | POA: Diagnosis not present

## 2019-03-21 DIAGNOSIS — B349 Viral infection, unspecified: Secondary | ICD-10-CM | POA: Diagnosis not present

## 2019-03-29 ENCOUNTER — Telehealth: Payer: Self-pay | Admitting: General Practice

## 2019-03-29 NOTE — Telephone Encounter (Signed)
Patient called to cancel appointment for this morning due to death in the family.  Offered to reschedule appointment, however, pt stated that she would call to reschedule.

## 2019-04-16 ENCOUNTER — Ambulatory Visit (HOSPITAL_COMMUNITY): Payer: Medicaid Other | Admitting: Psychiatry

## 2019-04-16 ENCOUNTER — Other Ambulatory Visit: Payer: Self-pay

## 2019-04-22 ENCOUNTER — Ambulatory Visit (HOSPITAL_COMMUNITY): Payer: Medicaid Other | Admitting: Psychiatry

## 2019-05-09 ENCOUNTER — Other Ambulatory Visit: Payer: Self-pay

## 2019-05-09 ENCOUNTER — Encounter: Payer: Self-pay | Admitting: General Practice

## 2019-05-09 ENCOUNTER — Ambulatory Visit (INDEPENDENT_AMBULATORY_CARE_PROVIDER_SITE_OTHER): Payer: Medicaid Other | Admitting: Obstetrics and Gynecology

## 2019-05-09 ENCOUNTER — Encounter: Payer: Self-pay | Admitting: Obstetrics and Gynecology

## 2019-05-09 VITALS — BP 108/71 | HR 69 | Temp 98.0°F | Wt 168.6 lb

## 2019-05-09 DIAGNOSIS — Z3400 Encounter for supervision of normal first pregnancy, unspecified trimester: Secondary | ICD-10-CM | POA: Diagnosis not present

## 2019-05-09 DIAGNOSIS — Z3A27 27 weeks gestation of pregnancy: Secondary | ICD-10-CM

## 2019-05-09 DIAGNOSIS — O99342 Other mental disorders complicating pregnancy, second trimester: Secondary | ICD-10-CM

## 2019-05-09 DIAGNOSIS — Z2913 Encounter for prophylactic Rho(D) immune globulin: Secondary | ICD-10-CM

## 2019-05-09 DIAGNOSIS — O360120 Maternal care for anti-D [Rh] antibodies, second trimester, not applicable or unspecified: Secondary | ICD-10-CM

## 2019-05-09 DIAGNOSIS — O99340 Other mental disorders complicating pregnancy, unspecified trimester: Secondary | ICD-10-CM

## 2019-05-09 DIAGNOSIS — F1290 Cannabis use, unspecified, uncomplicated: Secondary | ICD-10-CM

## 2019-05-09 DIAGNOSIS — O99322 Drug use complicating pregnancy, second trimester: Secondary | ICD-10-CM

## 2019-05-09 DIAGNOSIS — Z23 Encounter for immunization: Secondary | ICD-10-CM

## 2019-05-09 DIAGNOSIS — F329 Major depressive disorder, single episode, unspecified: Secondary | ICD-10-CM

## 2019-05-09 MED ORDER — RHO D IMMUNE GLOBULIN 1500 UNIT/2ML IJ SOSY
300.0000 ug | PREFILLED_SYRINGE | Freq: Once | INTRAMUSCULAR | Status: AC
  Administered 2019-05-09: 09:00:00 300 ug via INTRAMUSCULAR

## 2019-05-09 NOTE — Addendum Note (Signed)
Addended by: Gwyndolyn Saxon on: 05/09/2019 10:31 AM   Modules accepted: Orders

## 2019-05-09 NOTE — Progress Notes (Signed)
Submitted referral for patient to began outpatient behavioral health and medication management with psychiatry. Due to continuous high phq9 scores and reports of suicidal ideation.

## 2019-05-09 NOTE — Progress Notes (Signed)
   LOW-RISK PREGNANCY OFFICE VISIT Patient name: Emily Welch MRN [id number]  Date of birth: [birth year] Chief Complaint:   Routine Prenatal Visit  History of Present Illness:   Maureena Dabbs is a 19 y.o. G1P0 female at [redacted]w[redacted]d with an Estimated Date of Delivery: 08/06/19 being seen today for ongoing management of a low-risk pregnancy.  Today she reports no complaints. She was scheduled to have 2 hr GTT today, but is not fasting. She states no one told her she had to be fasting. She reports continued THC use.  Contractions: Not present. Vag. Bleeding: None.  Movement: Present. denies leaking of fluid. Review of Systems:   Pertinent items are noted in HPI Denies abnormal vaginal discharge w/ itching/odor/irritation, headaches, visual changes, shortness of breath, chest pain, abdominal pain, severe nausea/vomiting, or problems with urination or bowel movements unless otherwise stated above. Pertinent History Reviewed:  Reviewed past medical,surgical, social, obstetrical and family history.  Reviewed problem list, medications and allergies. Physical Assessment:   Vitals:   05/09/19 0809  BP: 108/71  Pulse: 69  Temp: 98 F (36.7 C)  Weight: 168 lb 9.6 oz (76.5 kg)  Body mass index is 32.93 kg/m.        Physical Examination:   General appearance: Well appearing, and in no distress  Mental status: Alert, oriented to person, place, and time  Skin: Warm & dry  Cardiovascular: Normal heart rate noted  Respiratory: Normal respiratory effort, no distress  Abdomen: Soft, gravid, nontender  Pelvic: Cervical exam deferred         Extremities: Edema: None  Fetal Status: Fetal Heart Rate (bpm): 137 Fundal Height: 26 cm Movement: Present    No results found for this or any previous visit (from the past 24 hour(s)).  Assessment & Plan:  1) Low-risk pregnancy G1P0 at [redacted]w[redacted]d with an Estimated Date of Delivery: 08/06/19   2) Supervision of normal first pregnancy, antepartum  - HIV Antibody  (routine testing w rflx),  - RPR,  - CBC,  - Tdap vaccine greater than or equal to 7yo IM,  - rho (d) immune globulin (RHIG/RHOPHYLAC) injection 300 mcg,   3) Marijuana use, continuous - Encouraged to d/c use of MJ   Meds:  Meds ordered this encounter  Medications  . rho (d) immune globulin (RHIG/RHOPHYLAC) injection 300 mcg   Labs/procedures today: 3rd trimester labs  Plan:  Continue routine obstetrical care   Reviewed: Preterm labor symptoms and general obstetric precautions including but not limited to vaginal bleeding, contractions, leaking of fluid and fetal movement were reviewed in detail with the patient.  All questions were answered. Has home bp cuff.Check bp weekly, let us know if >140/90.   Follow-up: Return in about 5 weeks (around 06/13/2019) for Return OB - My Chart video.  Orders Placed This Encounter  Procedures  . Tdap vaccine greater than or equal to 7yo IM  . HIV Antibody (routine testing w rflx)  . RPR  . CBC   Raelyn Mora MSN, CNM 05/09/2019

## 2019-05-09 NOTE — Patient Instructions (Signed)
Fetal Movement Counts Patient Name: ________________________________________________ Patient Due Date: ____________________ What is a fetal movement count?  A fetal movement count is the number of times that you feel your baby move during a certain amount of time. This may also be called a fetal kick count. A fetal movement count is recommended for every pregnant woman. You may be asked to start counting fetal movements as early as week 28 of your pregnancy. Pay attention to when your baby is most active. You may notice your baby's sleep and wake cycles. You may also notice things that make your baby move more. You should do a fetal movement count:  When your baby is normally most active.  At the same time each day. A good time to count movements is while you are resting, after having something to eat and drink. How do I count fetal movements? 1. Find a quiet, comfortable area. Sit, or lie down on your side. 2. Write down the date, the start time and stop time, and the number of movements that you felt between those two times. Take this information with you to your health care visits. 3. Write down your start time when you feel the first movement. 4. Count kicks, flutters, swishes, rolls, and jabs. You should feel at least 10 movements. 5. You may stop counting after you have felt 10 movements, or if you have been counting for 2 hours. Write down the stop time. 6. If you do not feel 10 movements in 2 hours, contact your health care provider for further instructions. Your health care provider may want to do additional tests to assess your baby's well-being. Contact a health care provider if:  You feel fewer than 10 movements in 2 hours.  Your baby is not moving like he or she usually does. Date: ____________ Start time: ____________ Stop time: ____________ Movements: ____________ Date: ____________ Start time: ____________ Stop time: ____________ Movements: ____________ Date: ____________  Start time: ____________ Stop time: ____________ Movements: ____________ Date: ____________ Start time: ____________ Stop time: ____________ Movements: ____________ Date: ____________ Start time: ____________ Stop time: ____________ Movements: ____________ Date: ____________ Start time: ____________ Stop time: ____________ Movements: ____________ Date: ____________ Start time: ____________ Stop time: ____________ Movements: ____________ Date: ____________ Start time: ____________ Stop time: ____________ Movements: ____________ Date: ____________ Start time: ____________ Stop time: ____________ Movements: ____________ This information is not intended to replace advice given to you by your health care provider. Make sure you discuss any questions you have with your health care provider. Document Revised: 08/30/2018 Document Reviewed: 08/30/2018 Elsevier Patient Education  2020 [employer] Inc[employer] Iron-Rich Diet  Iron is a mineral that helps your body to produce hemoglobin. Hemoglobin is a protein in red blood cells that carries oxygen to your body's tissues. Eating too little iron may cause you to feel weak and tired, and it can increase your risk of infection. Iron is naturally found in many foods, and many foods have iron added to them (iron-fortified foods). You may need to follow an iron-rich diet if you do not have enough iron in your body due to certain medical conditions. The amount of iron that you need each day depends on your age, your sex, and any medical conditions you have. Follow instructions from your health care provider or a diet and nutrition specialist (dietitian) about how much iron you should eat each day. What are tips for following this plan? Reading food labels  Check food labels to see how many milligrams (mg) of iron are in each   serving. Cooking  Cook foods in pots and pans that are made from iron.  Take these steps to make it easier for your body to absorb iron from certain  foods: ? Soak beans overnight before cooking. ? Soak whole grains overnight and drain them before using. ? Ferment flours before baking, such as by using yeast in bread dough. Meal planning  When you eat foods that contain iron, you should eat them with foods that are high in vitamin C. These include oranges, peppers, tomatoes, potatoes, and mango. Vitamin C helps your body to absorb iron. General information  Take iron supplements only as told by your health care provider. An overdose of iron can be life-threatening. If you were prescribed iron supplements, take them with orange juice or a vitamin C supplement.  When you eat iron-fortified foods or take an iron supplement, you should also eat foods that naturally contain iron, such as meat, poultry, and fish. Eating naturally iron-rich foods helps your body to absorb the iron that is added to other foods or contained in a supplement.  Certain foods and drinks prevent your body from absorbing iron properly. Avoid eating these foods in the same meal as iron-rich foods or with iron supplements. These foods include: ? Coffee, black tea, and red wine. ? Milk, dairy products, and foods that are high in calcium. ? Beans and soybeans. ? Whole grains. What foods should I eat? Fruits Prunes. Raisins. Eat fruits high in vitamin C, such as oranges, grapefruits, and strawberries, alongside iron-rich foods. Vegetables Spinach (cooked). Green peas. Broccoli. Fermented vegetables. Eat vegetables high in vitamin C, such as leafy greens, potatoes, bell peppers, and tomatoes, alongside iron-rich foods. Grains Iron-fortified breakfast cereal. Iron-fortified whole-wheat bread. Enriched rice. Sprouted grains. Meats and other proteins Beef liver. Oysters. Beef. Shrimp. Turkey. Chicken. Tuna. Sardines. Chickpeas. Nuts. Tofu. Pumpkin seeds. Beverages Tomato juice. Fresh orange juice. Prune juice. Hibiscus tea. Fortified instant breakfast shakes. Sweets and  desserts Blackstrap molasses. Seasonings and condiments Tahini. Fermented soy sauce. Other foods Wheat germ. The items listed above may not be a complete list of recommended foods and beverages. Contact a dietitian for more information. What foods should I avoid? Grains Whole grains. Bran cereal. Bran flour. Oats. Meats and other proteins Soybeans. Products made from soy protein. Black beans. Lentils. Mung beans. Split peas. Dairy Milk. Cream. Cheese. Yogurt. Cottage cheese. Beverages Coffee. Black tea. Red wine. Sweets and desserts Cocoa. Chocolate. Ice cream. Other foods Basil. Oregano. Large amounts of parsley. The items listed above may not be a complete list of foods and beverages to avoid. Contact a dietitian for more information. Summary  Iron is a mineral that helps your body to produce hemoglobin. Hemoglobin is a protein in red blood cells that carries oxygen to your body's tissues.  Iron is naturally found in many foods, and many foods have iron added to them (iron-fortified foods).  When you eat foods that contain iron, you should eat them with foods that are high in vitamin C. Vitamin C helps your body to absorb iron.  Certain foods and drinks prevent your body from absorbing iron properly, such as whole grains and dairy products. You should avoid eating these foods in the same meal as iron-rich foods or with iron supplements. This information is not intended to replace advice given to you by your health care provider. Make sure you discuss any questions you have with your health care provider. Document Revised: 12/23/2016 Document Reviewed: 12/06/2016 Elsevier Patient Education  2020 Elsevier   Inc.  

## 2019-05-10 ENCOUNTER — Encounter: Payer: Self-pay | Admitting: General Practice

## 2019-05-10 LAB — HIV ANTIBODY (ROUTINE TESTING W REFLEX): HIV Screen 4th Generation wRfx: NONREACTIVE

## 2019-05-10 LAB — CBC
Hematocrit: 36.1 % (ref 34.0–46.6)
Hemoglobin: 12.1 g/dL (ref 11.1–15.9)
MCH: 30.8 pg (ref 26.6–33.0)
MCHC: 33.5 g/dL (ref 31.5–35.7)
MCV: 92 fL (ref 79–97)
Platelets: 229 10*3/uL (ref 150–450)
RBC: 3.93 x10E6/uL (ref 3.77–5.28)
RDW: 12.1 % (ref 11.7–15.4)
WBC: 6.4 10*3/uL (ref 3.4–10.8)

## 2019-05-10 LAB — RPR: RPR Ser Ql: NONREACTIVE

## 2019-05-14 ENCOUNTER — Other Ambulatory Visit: Payer: Self-pay

## 2019-05-14 ENCOUNTER — Inpatient Hospital Stay (HOSPITAL_COMMUNITY)
Admission: AD | Admit: 2019-05-14 | Discharge: 2019-05-14 | Disposition: A | Discharge status: Home / Self Care | Payer: Medicaid Other | Source: Ambulatory Visit | Attending: Family Medicine | Admitting: Family Medicine

## 2019-05-14 ENCOUNTER — Telehealth: Payer: Self-pay | Admitting: Obstetrics and Gynecology

## 2019-05-14 ENCOUNTER — Encounter (HOSPITAL_COMMUNITY): Payer: Self-pay | Admitting: Family Medicine

## 2019-05-14 DIAGNOSIS — Z3490 Encounter for supervision of normal pregnancy, unspecified, unspecified trimester: Secondary | ICD-10-CM

## 2019-05-14 DIAGNOSIS — Z6791 Unspecified blood type, Rh negative: Secondary | ICD-10-CM | POA: Insufficient documentation

## 2019-05-14 DIAGNOSIS — Z3A28 28 weeks gestation of pregnancy: Secondary | ICD-10-CM | POA: Diagnosis not present

## 2019-05-14 DIAGNOSIS — W19XXXA Unspecified fall, initial encounter: Secondary | ICD-10-CM

## 2019-05-14 DIAGNOSIS — O99891 Other specified diseases and conditions complicating pregnancy: Secondary | ICD-10-CM

## 2019-05-14 DIAGNOSIS — M549 Dorsalgia, unspecified: Secondary | ICD-10-CM | POA: Diagnosis not present

## 2019-05-14 DIAGNOSIS — W101XXA Fall (on)(from) sidewalk curb, initial encounter: Secondary | ICD-10-CM | POA: Insufficient documentation

## 2019-05-14 DIAGNOSIS — B9689 Other specified bacterial agents as the cause of diseases classified elsewhere: Secondary | ICD-10-CM

## 2019-05-14 DIAGNOSIS — Z3400 Encounter for supervision of normal first pregnancy, unspecified trimester: Secondary | ICD-10-CM

## 2019-05-14 DIAGNOSIS — O26893 Other specified pregnancy related conditions, third trimester: Secondary | ICD-10-CM | POA: Diagnosis present

## 2019-05-14 DIAGNOSIS — A749 Chlamydial infection, unspecified: Secondary | ICD-10-CM

## 2019-05-14 DIAGNOSIS — Z3689 Encounter for other specified antenatal screening: Secondary | ICD-10-CM

## 2019-05-14 DIAGNOSIS — W01198A Fall on same level from slipping, tripping and stumbling with subsequent striking against other object, initial encounter: Secondary | ICD-10-CM

## 2019-05-14 DIAGNOSIS — Z87891 Personal history of nicotine dependence: Secondary | ICD-10-CM | POA: Insufficient documentation

## 2019-05-14 DIAGNOSIS — O210 Mild hyperemesis gravidarum: Secondary | ICD-10-CM

## 2019-05-14 HISTORY — DX: Unspecified infectious disease: B99.9

## 2019-05-14 LAB — URINALYSIS, ROUTINE W REFLEX MICROSCOPIC
Bacteria, UA: NONE SEEN
Bilirubin Urine: NEGATIVE
Glucose, UA: NEGATIVE mg/dL
Hgb urine dipstick: NEGATIVE
Ketones, ur: NEGATIVE mg/dL
Nitrite: NEGATIVE
Protein, ur: NEGATIVE mg/dL
Specific Gravity, Urine: 1.024 (ref 1.005–1.030)
pH: 6 (ref 5.0–8.0)

## 2019-05-14 MED ORDER — CYCLOBENZAPRINE HCL 10 MG PO TABS: 10.0000 mg | ORAL_TABLET | Freq: Three times a day (TID) | ORAL | 0 refills | Status: DC | PRN

## 2019-05-14 MED ORDER — CYCLOBENZAPRINE HCL 5 MG PO TABS
10.0000 mg | ORAL_TABLET | Freq: Three times a day (TID) | ORAL | Status: DC | PRN
  Administered 2019-05-14: 10 mg via ORAL
  Filled 2019-05-14: qty 2

## 2019-05-14 MED ORDER — ACETAMINOPHEN 500 MG PO TABS
1000.0000 mg | ORAL_TABLET | Freq: Four times a day (QID) | ORAL | Status: DC | PRN
  Administered 2019-05-14: 13:00:00 1000 mg via ORAL
  Filled 2019-05-14: qty 2

## 2019-05-14 NOTE — Telephone Encounter (Signed)
Patient called to report that she fell on her abdomen this morning at 0700. She was walking home and tripped on something outside. She now reports tailbone pain. (+) FM. She denies VB or LOF. Advised to report to [facility] for prolonged NST. [facility] provider notified. [facility]U charge RN notified.  Raelyn Mora, CNM

## 2019-05-14 NOTE — MAU Provider Note (Addendum)
History     CSN: [id number]  Arrival date and time: 05/14/19 1115   First Provider Initiated Contact with Patient 05/14/19 1217      Chief Complaint  Patient presents with  . Abdominal Pain  . Back Pain  . Fall   19 y.o. G1 @28 .0 wks presenting after a fall. Reports tripping over a curb and falling onto abdomen. Event occurred around 0730. Denies Vb or LOF. Reports LBP which was present before fall but worse since. New onset tailbone pain and right sided and pain since fall. Rates 8/10. Has not taken anything for it. Reports good FM. No head trauma or LOC.    OB History    Gravida  1   Para      Term      Preterm      AB      Living        SAB      TAB      Ectopic      Multiple      Live Births              Past Medical History:  Diagnosis Date  . Asthma   . Depression    gets emotional, meds helping  . Infection    UTI    Past Surgical History:  Procedure Laterality Date  . NO PAST SURGERIES      Family History  Problem Relation Age of Onset  . Heart disease Father   . Diabetes Sister     Social History   Tobacco Use  . Smoking status: Former Smoker    Types: Cigarettes  . Smokeless tobacco: Former  . Tobacco comment: quit with preg  Substance Use Topics  . Alcohol use: Not Currently  . Drug use: Yes    Types: Marijuana    Comment: trying to stop, last was 2 days ago    Allergies: No Known Allergies  Medications Prior to Admission  Medication Sig Dispense Refill Last Dose  . DICLEGIS 10-10 MG TBEC Take 1 tablet by mouth at bedtime as needed. 60 tablet 2 Past Month at Unknown time  . FLUoxetine (PROZAC) 40 MG capsule Take 1 capsule (40 mg total) by mouth daily. 30 capsule 2 05/13/2019 at Unknown time  . Prenatal MV & Min w/FA-DHA (PRENATAL ADULT GUMMY/DHA/FA) 0.4-25 MG CHEW Chew 1 tablet by mouth daily. 30 tablet 12 05/13/2019 at Unknown time  . Blood Pressure Monitoring (BLOOD PRESSURE MONITOR AUTOMAT) DEVI 1 Device by Does  not apply route daily. Automatic Blood pressure cuff regular or large size. Monitor blood pressure regularly at home. ICD-10 Code: O09.90 1 each 0   . famotidine (PEPCID) 20 MG tablet Take 1 tablet (20 mg total) by mouth 2 (two) times daily. 60 tablet 1   . omeprazole (PRILOSEC) 20 MG capsule Take 1 capsule (20 mg total) by mouth daily. (Patient not taking: Reported on 01/08/2019) 30 capsule 3   . promethazine (PHENERGAN) 25 MG tablet Take 1 tablet (25 mg total) by mouth every 6 (six) hours as needed for nausea or vomiting. (Patient not taking: Reported on 01/08/2019) 30 tablet 3     Review of Systems  Gastrointestinal: Positive for abdominal pain.  Genitourinary: Negative for vaginal bleeding and vaginal discharge.  Musculoskeletal: Positive for back pain.  Neurological: Negative for syncope.   Physical Exam   Blood pressure 117/65, pulse 72, temperature 98.6 F (37 C), temperature source Oral, resp. rate 18, height 5' (1.524 m), weight  75.2 kg, last menstrual period 10/20/2018, SpO2 100 %, unknown if currently breastfeeding.  Physical Exam  Nursing note and vitals reviewed. Constitutional: She is oriented to person, place, and time. She appears well-developed and well-nourished. No distress.  HENT:  Head: Normocephalic and atraumatic.  Cardiovascular: Normal rate.  Respiratory: Effort normal. No respiratory distress.  GI: Soft. She exhibits no distension. There is abdominal tenderness in the right upper quadrant and right lower quadrant.  gravid  Musculoskeletal:        General: Normal range of motion.     Cervical back: Normal range of motion. Normal.     Thoracic back: Normal.     Lumbar back: Tenderness present. No edema or deformity.  Neurological: She is alert and oriented to person, place, and time.  Skin: Skin is warm and dry.  Psychiatric: She has a normal mood and affect.  EFM: 130 bpm, mod variability, + accels, no decels Toco: none  Results for orders placed or  performed during the hospital encounter of 05/14/19 (from the past 24 hour(s))  Urinalysis, Routine w reflex microscopic     Status: Abnormal   Collection Time: 05/14/19 12:16 PM  Result Value Ref Range   Color, Urine YELLOW YELLOW   APPearance HAZY (A) CLEAR   Specific Gravity, Urine 1.024 1.005 - 1.030   pH 6.0 5.0 - 8.0   Glucose, UA NEGATIVE NEGATIVE mg/dL   Hgb urine dipstick NEGATIVE NEGATIVE   Bilirubin Urine NEGATIVE NEGATIVE   Ketones, ur NEGATIVE NEGATIVE mg/dL   Protein, ur NEGATIVE NEGATIVE mg/dL   Nitrite NEGATIVE NEGATIVE   Leukocytes,Ua TRACE (A) NEGATIVE   RBC / HPF 0-5 0 - 5 RBC/hpf   WBC, UA 0-5 0 - 5 WBC/hpf   Bacteria, UA NONE SEEN NONE SEEN   Squamous Epithelial / LPF 11-20 0 - 5   Mucus PRESENT    MAU Course  Procedures Prolonged EFM Flexeril Tylenol Heating pad  MDM No evidence of abruption, FHT reassuring. Pt reports improved back pain, denies abd pain after meds. Rh negative, had Rhogam 5 days ago. Stable for discharge home.   Assessment and Plan  [redacted] weeks gestation S/p fall Reactive NST Back pain Discharge home Follow up [place] as scheduled PTL/abruption precautions Rx Flexeril  Allergies as of 05/14/2019   No Known Allergies     Medication List    TAKE these medications   Blood Pressure Monitor Automat Devi 1 Device by Does not apply route daily. Automatic Blood pressure cuff regular or large size. Monitor blood pressure regularly at home. ICD-10 Code: O09.90   cyclobenzaprine 10 MG tablet Commonly known as: FLEXERIL Take 1 tablet (10 mg total) by mouth 3 (three) times daily as needed for muscle spasms.   Diclegis 10-10 MG Tbec Generic drug: Doxylamine-Pyridoxine Take 1 tablet by mouth at bedtime as needed.   famotidine 20 MG tablet Commonly known as: Pepcid Take 1 tablet (20 mg total) by mouth 2 (two) times daily.   FLUoxetine 40 MG capsule Commonly known as: PROzac Take 1 capsule (40 mg total) by mouth daily.   omeprazole  20 MG capsule Commonly known as: PRILOSEC Take 1 capsule (20 mg total) by mouth daily.   Prenatal Adult Gummy/DHA/FA 0.4-25 MG Chew Chew 1 tablet by mouth daily.   promethazine 25 MG tablet Commonly known as: PHENERGAN Take 1 tablet (25 mg total) by mouth every 6 (six) hours as needed for nausea or vomiting.      Julianne Handler, CNM 05/14/2019, 4:12 PM

## 2019-05-14 NOTE — MAU Note (Signed)
Tripped on side of the sidewalk, fell onto belly on pavement. Having pain in tailbone, RLQ. Denies bleeding or LOF. Reports +FM

## 2019-05-14 NOTE — Discharge Instructions (Signed)
Preventing Injuries During Pregnancy  Injuries can happen during pregnancy. Minor falls and accidents usually do not harm you or your baby. But some injuries can harm you and your baby. Tell your doctor about any injury you suffer. What can I do to avoid injuries? Safety  Remove rugs and loose objects on the floor.  Wear comfortable shoes that have a good grip. Do not wear shoes that have high heels.  Always wear your seat belt in the car. The lap belt should be below your belly. Always drive safely.  Do not ride on a motorcycle. Activity  Do not take part in rough and violent activities or sports.  Avoid: ? Walking on wet or slippery floors. ? Lifting heavy pots of boiling or hot liquids. ? Fixing electrical problems. ? Being near fires. General instructions  Take over-the-counter and prescription medicines only as told by your doctor.  Know your blood type and the blood type of the baby's father.  If you are a victim of domestic violence: ? Call your local emergency services (911 in the [place] ? Contact the National Domestic Violence Hotline for help and support. Get help right away if:  You fall on your belly or receive any serious blow to your belly.  You have a stiff neck or neck pain after a fall or an injury.  You get a headache or have problems with vision after an injury.  You do not feel the baby move or the baby is not moving as much as normal.  You have been a victim of domestic violence or any other kind of attack.  You have been in a car accident.  You have bleeding from your vagina.  Fluid is leaking from your vagina.  You start to have cramping or pain in your belly (contractions).  You have very bad pain in your lower back.  You feel weak or pass out (faint).  You start to throw up (vomit) after an injury.  You have been burned. Summary  Some injuries that happen during pregnancy can do harm to the baby.  Tell your doctor about any  injury.  Take steps to avoid injury. This includes removing rugs and loose objects on the floor. Always wear your seat belt in the car.  Do not take part in rough and violent activities or sports.  Get help right away if you have any serious accident or injury. This information is not intended to replace advice given to you by your health care provider. Make sure you discuss any questions you have with your health care provider. Document Revised: 10/05/2018 Document Reviewed: 01/20/2016 Elsevier Patient Education  2020 [employer]  

## 2019-05-20 ENCOUNTER — Encounter: Payer: Self-pay | Admitting: General Practice

## 2019-05-20 ENCOUNTER — Other Ambulatory Visit: Payer: Self-pay

## 2019-05-20 ENCOUNTER — Other Ambulatory Visit (INDEPENDENT_AMBULATORY_CARE_PROVIDER_SITE_OTHER): Payer: Medicaid Other | Admitting: *Deleted

## 2019-05-20 DIAGNOSIS — Z3400 Encounter for supervision of normal first pregnancy, unspecified trimester: Secondary | ICD-10-CM | POA: Diagnosis not present

## 2019-05-20 NOTE — Progress Notes (Signed)
   Patient in clinic to complete her 2 hr gtt.  Clovis Pu, RN

## 2019-05-21 LAB — GLUCOSE TOLERANCE, 2 HOURS W/ 1HR
Glucose, 1 hour: 121 mg/dL (ref 65–179)
Glucose, 2 hour: 78 mg/dL (ref 65–152)
Glucose, Fasting: 66 mg/dL (ref 65–91)

## 2019-05-27 ENCOUNTER — Encounter (HOSPITAL_COMMUNITY): Payer: Self-pay | Admitting: Obstetrics and Gynecology

## 2019-05-27 ENCOUNTER — Inpatient Hospital Stay (HOSPITAL_COMMUNITY)
Admission: AD | Admit: 2019-05-27 | Discharge: 2019-05-27 | Disposition: A | Discharge status: Home / Self Care | Payer: Medicaid Other | Attending: Obstetrics and Gynecology | Admitting: Obstetrics and Gynecology

## 2019-05-27 ENCOUNTER — Other Ambulatory Visit: Payer: Self-pay

## 2019-05-27 DIAGNOSIS — R109 Unspecified abdominal pain: Secondary | ICD-10-CM | POA: Insufficient documentation

## 2019-05-27 DIAGNOSIS — M549 Dorsalgia, unspecified: Secondary | ICD-10-CM

## 2019-05-27 DIAGNOSIS — E161 Other hypoglycemia: Secondary | ICD-10-CM | POA: Diagnosis not present

## 2019-05-27 DIAGNOSIS — O99893 Other specified diseases and conditions complicating puerperium: Secondary | ICD-10-CM | POA: Diagnosis not present

## 2019-05-27 DIAGNOSIS — M545 Low back pain: Secondary | ICD-10-CM | POA: Insufficient documentation

## 2019-05-27 DIAGNOSIS — O99891 Other specified diseases and conditions complicating pregnancy: Secondary | ICD-10-CM

## 2019-05-27 DIAGNOSIS — O479 False labor, unspecified: Secondary | ICD-10-CM | POA: Diagnosis not present

## 2019-05-27 DIAGNOSIS — Z3A29 29 weeks gestation of pregnancy: Secondary | ICD-10-CM | POA: Insufficient documentation

## 2019-05-27 DIAGNOSIS — Z87891 Personal history of nicotine dependence: Secondary | ICD-10-CM | POA: Diagnosis not present

## 2019-05-27 DIAGNOSIS — E162 Hypoglycemia, unspecified: Secondary | ICD-10-CM | POA: Diagnosis not present

## 2019-05-27 DIAGNOSIS — R110 Nausea: Secondary | ICD-10-CM | POA: Diagnosis not present

## 2019-05-27 DIAGNOSIS — R030 Elevated blood-pressure reading, without diagnosis of hypertension: Secondary | ICD-10-CM | POA: Insufficient documentation

## 2019-05-27 DIAGNOSIS — O26893 Other specified pregnancy related conditions, third trimester: Secondary | ICD-10-CM | POA: Diagnosis not present

## 2019-05-27 DIAGNOSIS — R52 Pain, unspecified: Secondary | ICD-10-CM | POA: Diagnosis not present

## 2019-05-27 LAB — CBC
HCT: 39.3 % (ref 36.0–46.0)
Hemoglobin: 13.5 g/dL (ref 12.0–15.0)
MCH: 30 pg (ref 26.0–34.0)
MCHC: 34.4 g/dL (ref 30.0–36.0)
MCV: 87.3 fL (ref 80.0–100.0)
Platelets: 232 10*3/uL (ref 150–400)
RBC: 4.5 MIL/uL (ref 3.87–5.11)
RDW: 11.9 % (ref 11.5–15.5)
WBC: 7.1 10*3/uL (ref 4.0–10.5)
nRBC: 0 % (ref 0.0–0.2)

## 2019-05-27 LAB — PROTEIN / CREATININE RATIO, URINE
Creatinine, Urine: 280.86 mg/dL
Protein Creatinine Ratio: 0.1 mg/mg{Cre} (ref 0.00–0.15)
Total Protein, Urine: 28 mg/dL

## 2019-05-27 LAB — URINALYSIS, ROUTINE W REFLEX MICROSCOPIC
Bacteria, UA: NONE SEEN
Bilirubin Urine: NEGATIVE
Glucose, UA: NEGATIVE mg/dL
Hgb urine dipstick: NEGATIVE
Ketones, ur: 5 mg/dL — AB
Leukocytes,Ua: NEGATIVE
Nitrite: NEGATIVE
Protein, ur: 30 mg/dL — AB
Specific Gravity, Urine: 1.028 (ref 1.005–1.030)
pH: 6 (ref 5.0–8.0)

## 2019-05-27 LAB — COMPREHENSIVE METABOLIC PANEL
ALT: 13 U/L (ref 0–44)
AST: 13 U/L — ABNORMAL LOW (ref 15–41)
Albumin: 2.8 g/dL — ABNORMAL LOW (ref 3.5–5.0)
Alkaline Phosphatase: 104 U/L (ref 38–126)
Anion gap: 10 (ref 5–15)
BUN: 9 mg/dL (ref 6–20)
CO2: 21 mmol/L — ABNORMAL LOW (ref 22–32)
Calcium: 8.9 mg/dL (ref 8.9–10.3)
Chloride: 109 mmol/L (ref 98–111)
Creatinine, Ser: 0.76 mg/dL (ref 0.44–1.00)
GFR calc Af Amer: >60 mL/min (ref >60)
GFR calc non Af Amer: >60 mL/min (ref >60)
Glucose, Bld: 69 mg/dL — ABNORMAL LOW (ref 70–99)
Potassium: 3.7 mmol/L (ref 3.5–5.1)
Sodium: 140 mmol/L (ref 135–145)
Total Bilirubin: 0.8 mg/dL (ref 0.3–1.2)
Total Protein: 6.4 g/dL — ABNORMAL LOW (ref 6.5–8.1)

## 2019-05-27 MED ORDER — LACTATED RINGERS IV SOLN: INTRAVENOUS | Status: DC

## 2019-05-27 MED ORDER — ACETAMINOPHEN 500 MG PO TABS
1000.0000 mg | ORAL_TABLET | Freq: Once | ORAL | Status: AC
  Administered 2019-05-27: 1000 mg via ORAL
  Filled 2019-05-27: qty 2

## 2019-05-27 NOTE — MAU Provider Note (Signed)
Chief Complaint:  Abdominal Pain and Back Pain   First Provider Initiated Contact with Patient 05/27/19 1617     HPI: Emily Welch is a 19 y.o. G1P0 at [redacted]w[redacted]d who presents to maternity admissions via EMS reporting abdominal pain & back pain. Reports intermittent lower abdominal cramping but can't tell how frequent it is. Also has some constant low back pain. Denies n/v/d, constipation, dysuria, vaginal bleeding, or LOF. Good fetal movement.  No history of hypertension. Denies headache, visual disturbance or epigastric pain.   Location: abdomen & back Quality: cramping Severity: 8/10 in pain scale Duration: 2 days Timing: intermittent  Modifying factors: none Associated signs and symptoms: none  Pregnancy Course: [facility]  Past Medical History:  Diagnosis Date  . Asthma   . Depression    gets emotional, meds helping  . Infection    UTI   OB History  Gravida Para Term Preterm AB Living  1            SAB TAB Ectopic Multiple Live Births               # Outcome Date GA Lbr Len/2nd Weight Sex Delivery Anes PTL Lv  1 Current            Past Surgical History:  Procedure Laterality Date  . NO PAST SURGERIES     Family History  Problem Relation Age of Onset  . Heart disease Father   . Diabetes Sister    Social History   Tobacco Use  . Smoking status: Former Smoker    Types: Cigarettes  . Smokeless tobacco: Former Systems developer  . Tobacco comment: quit with preg  Substance Use Topics  . Alcohol use: Not Currently  . Drug use: Yes    Types: Marijuana    Comment: last used 05/26/19   No Known Allergies No medications prior to admission.    I have reviewed patient's Past Medical Hx, Surgical Hx, Family Hx, Social Hx, medications and allergies.   ROS:  Review of Systems  Constitutional: Negative.   Gastrointestinal: Positive for abdominal pain. Negative for constipation, diarrhea, nausea and vomiting.  Genitourinary: Negative.   Musculoskeletal: Positive for back pain.     Physical Exam   Patient Vitals for the past 24 hrs:  BP Temp Temp src Pulse Resp SpO2  05/27/19 1855 129/66 - - 65 16 -  05/27/19 1800 128/79 - - (!) 58 - -  05/27/19 1746 140/76 - - (!) 59 - -  05/27/19 1730 136/85 - - (!) 59 - -  05/27/19 1715 (!) 129/92 - - 60 - -  05/27/19 1705 132/88 - - 83 - -  05/27/19 1645 130/87 - - 66 - -  05/27/19 1630 129/77 - - 61 - -  05/27/19 1617 (!) 144/92 - - (!) 55 - -  05/27/19 1600 (!) 143/88 - - 67 - -  05/27/19 1545 140/86 - - (!) 58 - -  05/27/19 1540 (!) 144/87 98.3 F (36.8 C) Oral 60 18 100 %    Constitutional: Well-developed, well-nourished female in no acute distress.  Cardiovascular: normal rate & rhythm, no murmur Respiratory: normal effort, lung sounds clear throughout GI: Abd soft, non-tender, gravid appropriate for gestational age. Pos BS x 4 MS: Extremities nontender, no edema, normal ROM Neurologic: Alert and oriented x 4.  GU: NEFG. No blood   Dilation: Closed Effacement (%): Thick Cervical Position: Posterior Exam by:: Jorje Guild, NP  NST:  Baseline: 140 bpm, Variability: Good {> 6 bpm),  Accelerations: Non-reactive but appropriate for gestational age and Decelerations: Absent   Labs: Results for orders placed or performed during the hospital encounter of 05/27/19 (from the past 24 hour(s))  Urinalysis, Routine w reflex microscopic     Status: Abnormal   Collection Time: 05/27/19  4:44 PM  Result Value Ref Range   Color, Urine YELLOW YELLOW   APPearance CLEAR CLEAR   Specific Gravity, Urine 1.028 1.005 - 1.030   pH 6.0 5.0 - 8.0   Glucose, UA NEGATIVE NEGATIVE mg/dL   Hgb urine dipstick NEGATIVE NEGATIVE   Bilirubin Urine NEGATIVE NEGATIVE   Ketones, ur 5 (A) NEGATIVE mg/dL   Protein, ur 30 (A) NEGATIVE mg/dL   Nitrite NEGATIVE NEGATIVE   Leukocytes,Ua NEGATIVE NEGATIVE   WBC, UA 0-5 0 - 5 WBC/hpf   Bacteria, UA NONE SEEN NONE SEEN   Squamous Epithelial / LPF 0-5 0 - 5   Mucus PRESENT   Protein /  creatinine ratio, urine     Status: None   Collection Time: 05/27/19  4:44 PM  Result Value Ref Range   Creatinine, Urine 280.86 mg/dL   Total Protein, Urine 28 mg/dL   Protein Creatinine Ratio 0.10 0.00 - 0.15 mg/mg[Cre]  CBC     Status: None   Collection Time: 05/27/19  5:00 PM  Result Value Ref Range   WBC 7.1 4.0 - 10.5 K/uL   RBC 4.50 3.87 - 5.11 MIL/uL   Hemoglobin 13.5 12.0 - 15.0 g/dL   HCT 96.7 89.3 - 81.0 %   MCV 87.3 80.0 - 100.0 fL   MCH 30.0 26.0 - 34.0 pg   MCHC 34.4 30.0 - 36.0 g/dL   RDW 17.5 10.2 - 58.5 %   Platelets 232 150 - 400 K/uL   nRBC 0.0 0.0 - 0.2 %  Comprehensive metabolic panel     Status: Abnormal   Collection Time: 05/27/19  5:00 PM  Result Value Ref Range   Sodium 140 135 - 145 mmol/L   Potassium 3.7 3.5 - 5.1 mmol/L   Chloride 109 98 - 111 mmol/L   CO2 21 (L) 22 - 32 mmol/L   Glucose, Bld 69 (L) 70 - 99 mg/dL   BUN 9 6 - 20 mg/dL   Creatinine, Ser 2.77 0.44 - 1.00 mg/dL   Calcium 8.9 8.9 - 82.4 mg/dL   Total Protein 6.4 (L) 6.5 - 8.1 g/dL   Albumin 2.8 (L) 3.5 - 5.0 g/dL   AST 13 (L) 15 - 41 U/L   ALT 13 0 - 44 U/L   Alkaline Phosphatase 104 38 - 126 U/L   Total Bilirubin 0.8 0.3 - 1.2 mg/dL   GFR calc non Af Amer >60 >60 mL/min   GFR calc Af Amer >60 >60 mL/min   Anion gap 10 5 - 15    Imaging:  No results found.  MAU Course: Orders Placed This Encounter  Procedures  . Urinalysis, Routine w reflex microscopic  . CBC  . Comprehensive metabolic panel  . Protein / creatinine ratio, urine  . Discharge patient   Meds ordered this encounter  Medications  . lactated ringers infusion  . acetaminophen (TYLENOL) tablet 1,000 mg    MDM: Fetal tracing appropriate for gestation. No contractions on toco. Cervix closed/thick/firm. Abdomen soft & non tender  New onset elevated BPs. Asymptomatic. None severe range. PEC labs negative. Likely gestational hypertension but not >4 hrs apart today. Msg to office for patient to have BP check  later this week. Reviewed  preeclampsia s/s & reasons to return to MAU.   Tylenol 1 gm PO given for back pain. Reports moderate improvement in symptoms.   Assessment: 1. Elevated BP without diagnosis of hypertension   2. [redacted] weeks gestation of pregnancy   3. Back pain affecting pregnancy in third trimester     Plan: Discharge home in stable condition.  Take tylenol prn back pain Preeclampsia precautions reviewed Msg to [facility] for BP check this week    Allergies as of 05/27/2019   No Known Allergies     Medication List    STOP taking these medications   Diclegis 10-10 MG Tbec Generic drug: Doxylamine-Pyridoxine   famotidine 20 MG tablet Commonly known as: Pepcid   omeprazole 20 MG capsule Commonly known as: PRILOSEC   promethazine 25 MG tablet Commonly known as: PHENERGAN     TAKE these medications   Blood Pressure Monitor Automat Devi 1 Device by Does not apply route daily. Automatic Blood pressure cuff regular or large size. Monitor blood pressure regularly at home. ICD-10 Code: O09.90   cyclobenzaprine 10 MG tablet Commonly known as: FLEXERIL Take 1 tablet (10 mg total) by mouth 3 (three) times daily as needed for muscle spasms.   FLUoxetine 40 MG capsule Commonly known as: PROzac Take 1 capsule (40 mg total) by mouth daily.   Prenatal Adult Gummy/DHA/FA 0.4-25 MG Chew Chew 1 tablet by mouth daily.       Judeth Horn, NP 05/27/2019 7:15 PM]

## 2019-05-27 NOTE — MAU Note (Signed)
Patient was at work today when she started having CTX 3 min apart around 1410.  No CTX in past 20 minutes, but feeling some lower abdominal pressure.  Also c/o low back pain that started yesterday after she almost fell slipping on grease at work.  Back pain has been constant and feels worse when she sits down.  Denies VB or LOF. Reports +FM.

## 2019-05-27 NOTE — Discharge Instructions (Signed)
Back Pain in Pregnancy Back pain during pregnancy is common. Back pain may be caused by several factors that are related to changes during your pregnancy. Follow these instructions at home: Managing pain, stiffness, and swelling      If directed, for sudden (acute) back pain, put ice on the painful area. ? Put ice in a plastic bag. ? Place a towel between your skin and the bag. ? Leave the ice on for 20 minutes, 2-3 times per day.  If directed, apply heat to the affected area before you exercise. Use the heat source that your health care provider recommends, such as a moist heat pack or a heating pad. ? Place a towel between your skin and the heat source. ? Leave the heat on for 20-30 minutes. ? Remove the heat if your skin turns bright red. This is especially important if you are unable to feel pain, heat, or cold. You may have a greater risk of getting burned.  If directed, massage the affected area. Activity  Exercise as told by your health care provider. Gentle exercise is the best way to prevent or manage back pain.  Listen to your body when lifting. If lifting hurts, ask for help or bend your knees. This uses your leg muscles instead of your back muscles.  Squat down when picking up something from the floor. Do not bend over.  Only use bed rest for short periods as told by your health care provider. Bed rest should only be used for the most severe episodes of back pain. Standing, sitting, and lying down  Do not stand in one place for long periods of time.  Use good posture when sitting. Make sure your head rests over your shoulders and is not hanging forward. Use a pillow on your lower back if necessary.  Try sleeping on your side, preferably the left side, with a pregnancy support pillow or 1-2 regular pillows between your legs. ? If you have back pain after a night's rest, your bed may be too soft. ? A firm mattress may provide more support for your back during  pregnancy. General instructions  Do not wear high heels.  Eat a healthy diet. Try to gain weight within your health care provider's recommendations.  Use a maternity girdle, elastic sling, or back brace as told by your health care provider.  Take over-the-counter and prescription medicines only as told by your health care provider.  Work with a physical therapist or massage therapist to find ways to manage back pain. Acupuncture or massage therapy may be helpful.  Keep all follow-up visits as told by your health care provider. This is important. Contact a health care provider if:  Your back pain interferes with your daily activities.  You have increasing pain in other parts of your body. Get help right away if:  You develop numbness, tingling, weakness, or problems with the use of your arms or legs.  You develop severe back pain that is not controlled with medicine.  You have a change in bowel or bladder control.  You develop shortness of breath, dizziness, or you faint.  You develop nausea, vomiting, or sweating.  You have back pain that is a rhythmic, cramping pain similar to labor pains. Labor pain is usually 1-2 minutes apart, lasts for about 1 minute, and involves a bearing down feeling or pressure in your pelvis.  You have back pain and your water breaks or you have vaginal bleeding.  You have back pain or numbness  that travels down your leg.  Your back pain developed after you fell.  You develop pain on one side of your back.  You see blood in your urine.  You develop skin blisters in the area of your back pain. Summary  Back pain may be caused by several factors that are related to changes during your pregnancy.  Follow instructions as told by your health care provider for managing pain, stiffness, and swelling.  Exercise as told by your health care provider. Gentle exercise is the best way to prevent or manage back pain.  Take over-the-counter and  prescription medicines only as told by your health care provider.  Keep all follow-up visits as told by your health care provider. This is important. This information is not intended to replace advice given to you by your health care provider. Make sure you discuss any questions you have with your health care provider. Document Revised: 05/01/2018 Document Reviewed: 06/28/2017 Elsevier Patient Education  2020 [employer] Hypertension During Pregnancy High blood pressure (hypertension) is when the force of blood pumping through the arteries is too strong. Arteries are blood vessels that carry blood from the heart throughout the body. Hypertension during pregnancy can be mild or severe. Severe hypertension during pregnancy (preeclampsia) is a medical emergency that requires prompt evaluation and treatment. Different types of hypertension can happen during pregnancy. These include:  Chronic hypertension. This happens when you had high blood pressure before you became pregnant, and it continues during the pregnancy. Hypertension that develops before you are [redacted] weeks pregnant and continues during the pregnancy is also called chronic hypertension. If you have chronic hypertension, it will not go away after you have your baby. You will need follow-up visits with your health care provider after you have your baby. Your doctor may want you to keep taking medicine for your blood pressure.  Gestational hypertension. This is hypertension that develops after the 20th week of pregnancy. Gestational hypertension usually goes away after you have your baby, but your health care provider will need to monitor your blood pressure to make sure that it is getting better.  Preeclampsia. This is severe hypertension during pregnancy. This can cause serious complications for you and your baby and can also cause complications for you after the delivery of your baby.  Postpartum preeclampsia. You may develop severe  hypertension after giving birth. This usually occurs within 48 hours after childbirth but may occur up to 6 weeks after giving birth. This is rare. How does this affect me? Women who have hypertension during pregnancy have a greater chance of developing hypertension later in life or during future pregnancies. In some cases, hypertension during pregnancy can cause serious complications, such as:  Stroke.  Heart attack.  Injury to other organs, such as kidneys, lungs, or liver.  Preeclampsia.  Convulsions or seizures.  Placental abruption. How does this affect my baby? Hypertension during pregnancy can affect your baby. Your baby may:  Be born early (prematurely).  Not weigh as much as he or she should at birth (low birth weight).  Not tolerate labor well, leading to an unplanned cesarean delivery. What are the risks? There are certain factors that make it more likely for you to develop hypertension during pregnancy. These include:  Having hypertension during a previous pregnancy.  Being overweight.  Being age 21 or older.  Being pregnant for the first time.  Being pregnant with more than one baby.  Becoming pregnant using fertilization methods, such as IVF (in vitro fertilization).  Having other medical problems, such as diabetes, kidney disease, or lupus.  Having a family history of hypertension. What can I do to lower my risk? The exact cause of hypertension during pregnancy is not known. You may be able to lower your risk by:  Maintaining a healthy weight.  Eating a healthy and balanced diet.  Following your health care provider's instructions about treating any long-term conditions that you had before becoming pregnant. It is very important to keep all of your prenatal care appointments. Your health care provider will check your blood pressure and make sure that your pregnancy is progressing as expected. If a problem is found, early treatment can prevent  complications. How is this treated? Treatment for hypertension during pregnancy varies depending on the type of hypertension you have and how serious it is.  If you were taking medicine for high blood pressure before you became pregnant, talk with your health care provider. You may need to change medicine during pregnancy because some medicines, like ACE inhibitors, may not be considered safe for your baby.  If you have gestational hypertension, your health care provider may order medicine to treat this during pregnancy.  If you are at risk for preeclampsia, your health care provider may recommend that you take a low-dose aspirin during your pregnancy.  If you have severe hypertension, you may need to be hospitalized so you and your baby can be monitored closely. You may also need to be given medicine to lower your blood pressure. This medicine may be given by mouth or through an IV.  In some cases, if your condition gets worse, you may need to deliver your baby early. Follow these instructions at home: Eating and drinking   Drink enough fluid to keep your urine pale yellow.  Avoid caffeine. Lifestyle  Do not use any products that contain nicotine or tobacco, such as cigarettes, e-cigarettes, and chewing tobacco. If you need help quitting, ask your health care provider.  Do not use alcohol or drugs.  Avoid stress as much as possible.  Rest and get plenty of sleep.  Regular exercise can help to reduce your blood pressure. Ask your health care provider what kinds of exercise are best for you. General instructions  Take over-the-counter and prescription medicines only as told by your health care provider.  Keep all prenatal and follow-up visits as told by your health care provider. This is important. Contact a health care provider if:  You have symptoms that your health care provider told you may require more treatment or monitoring, such as: ? Headaches. ? Nausea or  vomiting. ? Abdominal pain. ? Dizziness. ? Light-headedness. Get help right away if:  You have: ? Severe abdominal pain that does not get better with treatment. ? A severe headache that does not get better. ? Vomiting that does not get better. ? Sudden, rapid weight gain. ? Sudden swelling in your hands, ankles, or face. ? Vaginal bleeding. ? Blood in your urine. ? Blurred or double vision. ? Shortness of breath or chest pain. ? Weakness on one side of your body. ? Difficulty speaking.  Your baby is not moving as much as usual. Summary  High blood pressure (hypertension) is when the force of blood pumping through the arteries is too strong.  Hypertension during pregnancy can cause problems for you and your baby.  Treatment for hypertension during pregnancy varies depending on the type of hypertension you have and how serious it is.  Keep all prenatal and follow-up visits  as told by your health care provider. This is important. This information is not intended to replace advice given to you by your health care provider. Make sure you discuss any questions you have with your health care provider. Document Revised: 05/03/2018 Document Reviewed: 02/06/2018 Elsevier Patient Education  2020 [employer].

## 2019-05-30 ENCOUNTER — Ambulatory Visit: Payer: Medicaid Other

## 2019-06-04 ENCOUNTER — Inpatient Hospital Stay (HOSPITAL_COMMUNITY)
Admission: AD | Admit: 2019-06-04 | Discharge: 2019-06-09 | DRG: 787 | Disposition: A | Discharge status: Home / Self Care | Payer: Medicaid Other | Attending: Obstetrics & Gynecology | Admitting: Obstetrics & Gynecology

## 2019-06-04 ENCOUNTER — Other Ambulatory Visit: Payer: Self-pay

## 2019-06-04 ENCOUNTER — Inpatient Hospital Stay (HOSPITAL_BASED_OUTPATIENT_CLINIC_OR_DEPARTMENT_OTHER): Payer: Medicaid Other

## 2019-06-04 ENCOUNTER — Encounter (HOSPITAL_COMMUNITY): Payer: Self-pay | Admitting: Obstetrics & Gynecology

## 2019-06-04 DIAGNOSIS — O26893 Other specified pregnancy related conditions, third trimester: Secondary | ICD-10-CM | POA: Diagnosis present

## 2019-06-04 DIAGNOSIS — O210 Mild hyperemesis gravidarum: Secondary | ICD-10-CM

## 2019-06-04 DIAGNOSIS — O365990 Maternal care for other known or suspected poor fetal growth, unspecified trimester, not applicable or unspecified: Secondary | ICD-10-CM | POA: Diagnosis not present

## 2019-06-04 DIAGNOSIS — O365930 Maternal care for other known or suspected poor fetal growth, third trimester, not applicable or unspecified: Principal | ICD-10-CM | POA: Diagnosis present

## 2019-06-04 DIAGNOSIS — O133 Gestational [pregnancy-induced] hypertension without significant proteinuria, third trimester: Secondary | ICD-10-CM

## 2019-06-04 DIAGNOSIS — N760 Acute vaginitis: Secondary | ICD-10-CM

## 2019-06-04 DIAGNOSIS — F1290 Cannabis use, unspecified, uncomplicated: Secondary | ICD-10-CM | POA: Diagnosis present

## 2019-06-04 DIAGNOSIS — O99344 Other mental disorders complicating childbirth: Secondary | ICD-10-CM | POA: Diagnosis present

## 2019-06-04 DIAGNOSIS — Z3400 Encounter for supervision of normal first pregnancy, unspecified trimester: Secondary | ICD-10-CM

## 2019-06-04 DIAGNOSIS — Z6791 Unspecified blood type, Rh negative: Secondary | ICD-10-CM | POA: Diagnosis not present

## 2019-06-04 DIAGNOSIS — Z3490 Encounter for supervision of normal pregnancy, unspecified, unspecified trimester: Secondary | ICD-10-CM

## 2019-06-04 DIAGNOSIS — F329 Major depressive disorder, single episode, unspecified: Secondary | ICD-10-CM | POA: Diagnosis present

## 2019-06-04 DIAGNOSIS — O99324 Drug use complicating childbirth: Secondary | ICD-10-CM | POA: Diagnosis present

## 2019-06-04 DIAGNOSIS — A749 Chlamydial infection, unspecified: Secondary | ICD-10-CM | POA: Diagnosis present

## 2019-06-04 DIAGNOSIS — Z3A31 31 weeks gestation of pregnancy: Secondary | ICD-10-CM

## 2019-06-04 DIAGNOSIS — Z20822 Contact with and (suspected) exposure to covid-19: Secondary | ICD-10-CM | POA: Diagnosis present

## 2019-06-04 DIAGNOSIS — O134 Gestational [pregnancy-induced] hypertension without significant proteinuria, complicating childbirth: Secondary | ICD-10-CM | POA: Diagnosis not present

## 2019-06-04 DIAGNOSIS — Z87891 Personal history of nicotine dependence: Secondary | ICD-10-CM | POA: Diagnosis not present

## 2019-06-04 DIAGNOSIS — Z30017 Encounter for initial prescription of implantable subdermal contraceptive: Secondary | ICD-10-CM | POA: Diagnosis not present

## 2019-06-04 DIAGNOSIS — R030 Elevated blood-pressure reading, without diagnosis of hypertension: Secondary | ICD-10-CM | POA: Diagnosis present

## 2019-06-04 DIAGNOSIS — O4103X0 Oligohydramnios, third trimester, not applicable or unspecified: Secondary | ICD-10-CM | POA: Diagnosis not present

## 2019-06-04 DIAGNOSIS — O98812 Other maternal infectious and parasitic diseases complicating pregnancy, second trimester: Secondary | ICD-10-CM | POA: Diagnosis present

## 2019-06-04 DIAGNOSIS — O368930 Maternal care for other specified fetal problems, third trimester, not applicable or unspecified: Secondary | ICD-10-CM | POA: Diagnosis not present

## 2019-06-04 DIAGNOSIS — O139 Gestational [pregnancy-induced] hypertension without significant proteinuria, unspecified trimester: Secondary | ICD-10-CM | POA: Diagnosis not present

## 2019-06-04 DIAGNOSIS — O26899 Other specified pregnancy related conditions, unspecified trimester: Secondary | ICD-10-CM

## 2019-06-04 DIAGNOSIS — B9689 Other specified bacterial agents as the cause of diseases classified elsewhere: Secondary | ICD-10-CM

## 2019-06-04 LAB — TYPE AND SCREEN
ABO/RH(D): A NEG
Antibody Screen: POSITIVE

## 2019-06-04 LAB — SARS CORONAVIRUS 2 BY RT PCR (HOSPITAL ORDER, PERFORMED IN ~~LOC~~ HOSPITAL LAB): SARS Coronavirus 2: NEGATIVE

## 2019-06-04 LAB — PROTEIN / CREATININE RATIO, URINE
Creatinine, Urine: 182.76 mg/dL
Protein Creatinine Ratio: 0.18 mg/mg{Cre} — ABNORMAL HIGH (ref 0.00–0.15)
Total Protein, Urine: 33 mg/dL

## 2019-06-04 LAB — COMPREHENSIVE METABOLIC PANEL
ALT: 17 U/L (ref 0–44)
AST: 15 U/L (ref 15–41)
Albumin: 2.6 g/dL — ABNORMAL LOW (ref 3.5–5.0)
Alkaline Phosphatase: 102 U/L (ref 38–126)
Anion gap: 10 (ref 5–15)
BUN: 8 mg/dL (ref 6–20)
CO2: 22 mmol/L (ref 22–32)
Calcium: 8.7 mg/dL — ABNORMAL LOW (ref 8.9–10.3)
Chloride: 105 mmol/L (ref 98–111)
Creatinine, Ser: 0.71 mg/dL (ref 0.44–1.00)
GFR calc Af Amer: >60 mL/min (ref >60)
GFR calc non Af Amer: >60 mL/min (ref >60)
Glucose, Bld: 82 mg/dL (ref 70–99)
Potassium: 4 mmol/L (ref 3.5–5.1)
Sodium: 137 mmol/L (ref 135–145)
Total Bilirubin: 0.8 mg/dL (ref 0.3–1.2)
Total Protein: 5.8 g/dL — ABNORMAL LOW (ref 6.5–8.1)

## 2019-06-04 LAB — CBC
HCT: 39 % (ref 36.0–46.0)
Hemoglobin: 13.5 g/dL (ref 12.0–15.0)
MCH: 30.2 pg (ref 26.0–34.0)
MCHC: 34.6 g/dL (ref 30.0–36.0)
MCV: 87.2 fL (ref 80.0–100.0)
Platelets: 218 10*3/uL (ref 150–400)
RBC: 4.47 MIL/uL (ref 3.87–5.11)
RDW: 12.1 % (ref 11.5–15.5)
WBC: 6.1 10*3/uL (ref 4.0–10.5)
nRBC: 0 % (ref 0.0–0.2)

## 2019-06-04 LAB — RAPID URINE DRUG SCREEN, HOSP PERFORMED
Amphetamines: NOT DETECTED
Barbiturates: NOT DETECTED
Benzodiazepines: NOT DETECTED
Cocaine: NOT DETECTED
Opiates: NOT DETECTED
Tetrahydrocannabinol: POSITIVE — AB

## 2019-06-04 MED ORDER — ACETAMINOPHEN 500 MG PO TABS
1000.0000 mg | ORAL_TABLET | Freq: Once | ORAL | Status: AC
  Administered 2019-06-04: 1000 mg via ORAL
  Filled 2019-06-04: qty 2

## 2019-06-04 MED ORDER — ACETAMINOPHEN 325 MG PO TABS
650.0000 mg | ORAL_TABLET | ORAL | Status: DC | PRN
  Administered 2019-06-05 – 2019-06-06 (×4): 650 mg via ORAL
  Filled 2019-06-04 (×4): qty 2

## 2019-06-04 MED ORDER — HYDRALAZINE HCL 20 MG/ML IJ SOLN: 10.0000 mg | INTRAMUSCULAR | Status: DC | PRN

## 2019-06-04 MED ORDER — CALCIUM CARBONATE ANTACID 500 MG PO CHEW: 2.0000 | CHEWABLE_TABLET | ORAL | Status: DC | PRN

## 2019-06-04 MED ORDER — LABETALOL HCL 5 MG/ML IV SOLN: 20.0000 mg | INTRAVENOUS | Status: DC | PRN

## 2019-06-04 MED ORDER — LABETALOL HCL 5 MG/ML IV SOLN: 80.0000 mg | INTRAVENOUS | Status: DC | PRN

## 2019-06-04 MED ORDER — ZOLPIDEM TARTRATE 5 MG PO TABS: 5.0000 mg | ORAL_TABLET | Freq: Every evening | ORAL | Status: DC | PRN

## 2019-06-04 MED ORDER — BETAMETHASONE SOD PHOS & ACET 6 (3-3) MG/ML IJ SUSP
12.0000 mg | INTRAMUSCULAR | Status: AC
  Administered 2019-06-04 – 2019-06-05 (×2): 12 mg via INTRAMUSCULAR
  Filled 2019-06-04: qty 5

## 2019-06-04 MED ORDER — MAGNESIUM SULFATE BOLUS VIA INFUSION
4.0000 g | Freq: Once | INTRAVENOUS | Status: AC
  Administered 2019-06-04: 4 g via INTRAVENOUS
  Filled 2019-06-04: qty 1000

## 2019-06-04 MED ORDER — PRENATAL MULTIVITAMIN CH
1.0000 | ORAL_TABLET | Freq: Every day | ORAL | Status: DC
  Administered 2019-06-05 – 2019-06-06 (×2): 1 via ORAL
  Filled 2019-06-04 (×3): qty 1

## 2019-06-04 MED ORDER — LABETALOL HCL 5 MG/ML IV SOLN: 40.0000 mg | INTRAVENOUS | Status: DC | PRN

## 2019-06-04 MED ORDER — MAGNESIUM SULFATE 40 GM/1000ML IV SOLN
2.0000 g/h | INTRAVENOUS | Status: DC
  Administered 2019-06-05: 2 g/h via INTRAVENOUS
  Filled 2019-06-04 (×2): qty 1000

## 2019-06-04 MED ORDER — DOCUSATE SODIUM 100 MG PO CAPS
100.0000 mg | ORAL_CAPSULE | Freq: Every day | ORAL | Status: DC
  Administered 2019-06-05 – 2019-06-06 (×2): 100 mg via ORAL
  Filled 2019-06-04 (×2): qty 1

## 2019-06-04 MED ORDER — ONDANSETRON HCL 4 MG/2ML IJ SOLN
4.0000 mg | Freq: Four times a day (QID) | INTRAMUSCULAR | Status: DC | PRN
  Administered 2019-06-04 – 2019-06-06 (×4): 4 mg via INTRAVENOUS
  Filled 2019-06-04 (×4): qty 2

## 2019-06-04 MED ORDER — LACTATED RINGERS IV SOLN: INTRAVENOUS | Status: DC

## 2019-06-04 NOTE — MAU Note (Signed)
PT missed blood pressure check at [facility]  on Thursday.  They told her to check it at home. Her last one was 159/123. Says they have been high all weekend. She denies headache, but sees spots and has nausea.Denies RUQ pain. Has some swelling occasionally.   Says she has some occasional BH cntx, denies LOF or bleeding. +FM.

## 2019-06-04 NOTE — H&P (Signed)
Emily Welch is a 19 y.o. female presenting for evaluation of severe pressures on her home cuff. She endorses spots in her field of vision, new onset when she signed in to MAU. She endorses headache on return from her BPP. She denies RUQ/epigastric pain, new onset swelling or weight gain. She denies vaginal bleeding, leaking of fluid, decreased fetal movement, fever, falls, or recent illness.    Patient receives prenatal care with [facility].  . OB History    Gravida  1   Para      Term      Preterm      AB      Living        SAB      TAB      Ectopic      Multiple      Live Births             Past Medical History:  Diagnosis Date  . Asthma   . Depression    gets emotional, meds helping  . Infection    UTI   Past Surgical History:  Procedure Laterality Date  . NO PAST SURGERIES     Family History: family history includes Diabetes in her sister; Heart disease in her father. Social History:  reports that she has quit smoking. Her smoking use included cigarettes. She has quit using smokeless tobacco. She reports previous alcohol use. She reports current drug use. Drug: Marijuana.    Maternal Diabetes: No Genetic Screening: Normal Maternal Ultrasounds/Referrals: IUGR, AEDF Fetal Ultrasounds or other Referrals:  Referred to Materal Fetal Medicine  Maternal Substance Abuse:  Yes:  Type: Marijuana Significant Maternal Medications:  None Significant Maternal Lab Results:  None Other Comments:  None  Review of Systems  Eyes: Positive for visual disturbance. Negative for photophobia.  Gastrointestinal: Negative for abdominal pain.  Musculoskeletal: Negative for back pain.  Neurological: Positive for dizziness and headaches.  All other systems reviewed and are negative.    Blood pressure 131/75, pulse (!) 52, temperature 98.3 F (36.8 C), temperature source Oral, resp. rate 18, height 5' (1.524 m), weight 75.1 kg, last menstrual period 10/20/2018,  SpO2 100 %, unknown if currently breastfeeding. Physical Exam  Nursing note and vitals reviewed. Constitutional: She is oriented to person, place, and time. She appears well-developed and well-nourished.  Cardiovascular: Normal rate, normal heart sounds and intact distal pulses.  Respiratory: Effort normal and breath sounds normal.  GI: Soft. She exhibits no distension. There is no abdominal tenderness. There is no rebound and no guarding.  Gravid  Neurological: She is alert and oriented to person, place, and time. She has normal strength. No cranial nerve deficit or sensory deficit.  Skin: Skin is warm and dry.  Psychiatric: She has a normal mood and affect. Her behavior is normal. Judgment and thought content normal.    Prenatal labs: ABO, Rh: A/Negative/-- (01/08 1024) Antibody: Negative (01/08 1024) Rubella: 5.99 (01/08 1024) RPR: Non Reactive (04/15 0817)  HBsAg: Negative (01/08 1024)  HIV: Non Reactive (04/15 0817)  GBS:   Unknown  Patient Vitals for the past 24 hrs:  BP Temp Temp src Pulse Resp SpO2 Height Weight  06/04/19 1421 (!) 144/87 98.2 F (36.8 C) - (!) 52 - - - -  06/04/19 1415 (!) 148/82 - - - - - - -  06/04/19 1331 - - - - - 100 % - -  06/04/19 1203 131/75 - - (!) 52 - - - -  06/04/19 1200 131/75 - - Marland Kitchen)  52 - 100 % - -  06/04/19 1156 - - - - - 99 % - -  06/04/19 1151 - - - - - 100 % - -  06/04/19 1146 136/82 - - (!) 54 - 100 % - -  06/04/19 1141 - - - - - 99 % - -  06/04/19 1136 - - - - - 100 % - -  06/04/19 1132 133/77 - - 61 - - - -  06/04/19 1131 - - - - - 100 % - -  06/04/19 1126 - - - - - 99 % - -  06/04/19 1117 (!) 144/82 - - (!) 55 - - - -  06/04/19 1101 (!) 156/112 - - 81 - 100 % - -  06/04/19 1056 - - - - - 100 % - -  06/04/19 1052 (!) 146/99 - - (!) 57 - - - -  06/04/19 1030 (!) 137/93 98.3 F (36.8 C) Oral 65 18 100 % 5' (1.524 m) 75.1 kg   Results for orders placed or performed during the hospital encounter of 06/04/19 (from the past 24  hour(s))  Protein / creatinine ratio, urine     Status: Abnormal   Collection Time: 06/04/19 10:50 AM  Result Value Ref Range   Creatinine, Urine 182.76 mg/dL   Total Protein, Urine 33 mg/dL   Protein Creatinine Ratio 0.18 (H) 0.00 - 0.15 mg/mg[Cre]  Rapid urine drug screen (hospital performed)     Status: Abnormal   Collection Time: 06/04/19 10:50 AM  Result Value Ref Range   Opiates NONE DETECTED NONE DETECTED   Cocaine NONE DETECTED NONE DETECTED   Benzodiazepines NONE DETECTED NONE DETECTED   Amphetamines NONE DETECTED NONE DETECTED   Tetrahydrocannabinol POSITIVE (A) NONE DETECTED   Barbiturates NONE DETECTED NONE DETECTED  CBC     Status: None   Collection Time: 06/04/19 11:32 AM  Result Value Ref Range   WBC 6.1 4.0 - 10.5 K/uL   RBC 4.47 3.87 - 5.11 MIL/uL   Hemoglobin 13.5 12.0 - 15.0 g/dL   HCT 28.4 13.2 - 44.0 %   MCV 87.2 80.0 - 100.0 fL   MCH 30.2 26.0 - 34.0 pg   MCHC 34.6 30.0 - 36.0 g/dL   RDW 10.2 72.5 - 36.6 %   Platelets 218 150 - 400 K/uL   nRBC 0.0 0.0 - 0.2 %  Comprehensive metabolic panel     Status: Abnormal   Collection Time: 06/04/19 11:32 AM  Result Value Ref Range   Sodium 137 135 - 145 mmol/L   Potassium 4.0 3.5 - 5.1 mmol/L   Chloride 105 98 - 111 mmol/L   CO2 22 22 - 32 mmol/L   Glucose, Bld 82 70 - 99 mg/dL   BUN 8 6 - 20 mg/dL   Creatinine, Ser 4.40 0.44 - 1.00 mg/dL   Calcium 8.7 (L) 8.9 - 10.3 mg/dL   Total Protein 5.8 (L) 6.5 - 8.1 g/dL   Albumin 2.6 (L) 3.5 - 5.0 g/dL   AST 15 15 - 41 U/L   ALT 17 0 - 44 U/L   Alkaline Phosphatase 102 38 - 126 U/L   Total Bilirubin 0.8 0.3 - 1.2 mg/dL   GFR calc non Af Amer >60 >60 mL/min   GFR calc Af Amer >60 >60 mL/min   Anion gap 10 5 - 15  Type and screen [facility]     Status: None (Preliminary result)   Collection Time: 06/04/19 12:55 PM  Result Value Ref Range   ABO/RH(D) PENDING    Antibody Screen PENDING    Sample Expiration      06/07/2019,2359 Performed at [facility], [place] [address] [place], [place] [address]     Assessment/Plan: --19 y.o. G1P0 at [redacted]w[redacted]d  --BPP 8/8 --Severe range blood pressure x 1 --PEC labs normal --New diagnosis FGR and AEDF --Admission advised per MFM --Per Dr. Roselie Awkward, admit to [facility], CNM 06/04/2019, 2:38 PM

## 2019-06-04 NOTE — Consult Note (Signed)
MFM Note  This patient was admitted to the hospital following severe range blood pressures that were noted on a home blood pressure cuff.  Her blood pressures in the MAU this afternoon were in the 130s to 150s over 80s to 100s range.  Her PIH labs are all within normal limits today.  Severe fetal growth restriction with an overall EFW of 2 pounds 4 ounces (less than the 1st percentile for her gestational age) is noted today.  Borderline oligohydramnios is noted today.  Doppler studies of the umbilical arteries performed today showed absent end-diastolic flow.  There were also periods of intermittent reversed end-diastolic flow.  A biophysical profile performed today was 8 out of 8.    Due to the ultrasound findings of intermittent reversed end-diastolic flow and a growth restricted fetus, the patient should be admitted to the hospital to receive a complete course of antenatal corticosteroids and daily fetal testing.    Magnesium sulfate should be administered for fetal neuro protection at her current gestational age should delivery be required.    Another umbilical artey Doppler study should be performed later this week on Thursday or Friday.  The timing of delivery will depend on the fetal status, her blood pressures, her blood work, and umbilical artery Doppler studies.   Should persistent reversed end-diastolic flow be noted, delivery should be considered at around 32 weeks.

## 2019-06-04 NOTE — Telephone Encounter (Signed)
Patient called stating she is having some dizziness, swelling of feet after work, and feeling "very sick". Blood pressure 156/120 heart rate 70. Nurse had patient wait 10 minutes to recheck BP. Repeat BP 157/123 heart rate 89. Patient also reported she vomit. Patient missed her blood pressure check last Thursday 05/30/19. Per patient, blood pressure reading on Saturday 160/90, 120/90, 152/86, 159/95. Sunday's reading 145/78. There is no provider in clinic with nurse today. Advised patient to be seen at MAU. Will provide work note if needed.  Clovis Pu, RN

## 2019-06-05 ENCOUNTER — Encounter (HOSPITAL_COMMUNITY): Payer: Self-pay | Admitting: Obstetrics & Gynecology

## 2019-06-05 DIAGNOSIS — O368930 Maternal care for other specified fetal problems, third trimester, not applicable or unspecified: Secondary | ICD-10-CM

## 2019-06-05 MED ORDER — LACTATED RINGERS IV BOLUS
1000.0000 mL | Freq: Once | INTRAVENOUS | Status: AC
  Administered 2019-06-05: 1000 mL via INTRAVENOUS

## 2019-06-05 NOTE — Progress Notes (Signed)
RN called MD on call, Dr. Adrian Blackwater, regarding FHR decels, decreased variability, and prolonged decel. MD reviewed strip. New orders given for a LR bolus.

## 2019-06-05 NOTE — Progress Notes (Signed)
Patient ID: Emily Welch, female   DOB: December 11, 2000, 19 y.o.   MRN: [id number] FACULTY PRACTICE ANTEPARTUM(COMPREHENSIVE) NOTE  Emily Welch is a 19 y.o. G1P0 at [redacted]w[redacted]d by early ultrasound who is admitted for IUGR and abnormal cord dopplers.   Fetal presentation is cephalic. Length of Stay:  1  Days  Subjective:  Patient reports the fetal movement as active. Patient reports uterine contraction  activity as none. Patient reports  vaginal bleeding as none. Patient describes fluid per vagina as None.  Vitals:  Blood pressure 122/75, pulse 81, temperature 97.7 F (36.5 C), temperature source Oral, resp. rate 19, height 5' (1.524 m), weight 75.1 kg, last menstrual period 10/20/2018, SpO2 100 %, unknown if currently breastfeeding. Physical Examination:  General appearance - alert, well appearing, and in no distress Heart - normal rate and regular rhythm Abdomen - soft, nontender, nondistended Fundal Height:  size equals dates Cervical Exam: Not evaluated.  Extremities: extremities normal, atraumatic, no cyanosis or edema and Homans sign is negative Membranes:intact  Fetal Monitoring:  Baseline: 125 bpm  Labs:  Results for orders placed or performed during the hospital encounter of 06/04/19 (from the past 24 hour(s))  CBC   Collection Time: 06/04/19 11:32 AM  Result Value Ref Range   WBC 6.1 4.0 - 10.5 K/uL   RBC 4.47 3.87 - 5.11 MIL/uL   Hemoglobin 13.5 12.0 - 15.0 g/dL   HCT 84.1 32.4 - 40.1 %   MCV 87.2 80.0 - 100.0 fL   MCH 30.2 26.0 - 34.0 pg   MCHC 34.6 30.0 - 36.0 g/dL   RDW 02.7 25.3 - 66.4 %   Platelets 218 150 - 400 K/uL   nRBC 0.0 0.0 - 0.2 %  Comprehensive metabolic panel   Collection Time: 06/04/19 11:32 AM  Result Value Ref Range   Sodium 137 135 - 145 mmol/L   Potassium 4.0 3.5 - 5.1 mmol/L   Chloride 105 98 - 111 mmol/L   CO2 22 22 - 32 mmol/L   Glucose, Bld 82 70 - 99 mg/dL   BUN 8 6 - 20 mg/dL   Creatinine, Ser 4.03 0.44 - 1.00 mg/dL   Calcium 8.7  (L) 8.9 - 10.3 mg/dL   Total Protein 5.8 (L) 6.5 - 8.1 g/dL   Albumin 2.6 (L) 3.5 - 5.0 g/dL   AST 15 15 - 41 U/L   ALT 17 0 - 44 U/L   Alkaline Phosphatase 102 38 - 126 U/L   Total Bilirubin 0.8 0.3 - 1.2 mg/dL   GFR calc non Af Amer >60 >60 mL/min   GFR calc Af Amer >60 >60 mL/min   Anion gap 10 5 - 15  Type and screen [facility]   Collection Time: 06/04/19 12:55 PM  Result Value Ref Range   ABO/RH(D) A NEG    Antibody Screen POS    Sample Expiration 06/07/2019,2359    Antibody Identification      PASSIVELY ACQUIRED ANTI-D Performed at [facility], [address] [place], [place] [address]   SARS Coronavirus 2 by RT PCR (hospital order, performed in [facility] hospital lab) Nasopharyngeal Nasopharyngeal Swab   Collection Time: 06/04/19  2:09 PM   Specimen: Nasopharyngeal Swab  Result Value Ref Range   SARS Coronavirus 2 NEGATIVE NEGATIVE     Medications:  Scheduled . betamethasone acetate-betamethasone sodium phosphate  12 mg Intramuscular Q24 Hr x 2  . docusate sodium  100 mg Oral Daily  . prenatal multivitamin  1  tablet Oral Q1200   I have reviewed the patient's current medications.  ASSESSMENT: Patient Active Problem List   Diagnosis Date Noted  . IUGR (intrauterine growth restriction) affecting care of mother 06/04/2019  . Major depressive disorder with current active episode 02/27/2019  . Rh negative status during pregnancy 02/05/2019  . Chlamydia infection affecting pregnancy in second trimester 02/05/2019  . Supervision of normal first pregnancy, antepartum 01/08/2019  . Bacterial vaginosis 12/03/2018  . Intrauterine pregnancy 12/03/2018  . Morning sickness 12/03/2018    PLAN: Continuous monitoring. Second BMZ due today. BPP dopplers in am. Delivery by 32 weeks  Emeterio Reeve 06/05/2019,11:11 AM

## 2019-06-06 ENCOUNTER — Encounter (HOSPITAL_COMMUNITY)
Admission: AD | Disposition: A | Discharge status: Home / Self Care | Payer: Self-pay | Source: Home / Self Care | Attending: Obstetrics & Gynecology

## 2019-06-06 ENCOUNTER — Inpatient Hospital Stay (HOSPITAL_COMMUNITY): Payer: Medicaid Other | Admitting: Anesthesiology

## 2019-06-06 ENCOUNTER — Encounter (HOSPITAL_COMMUNITY): Payer: Self-pay | Admitting: Obstetrics & Gynecology

## 2019-06-06 ENCOUNTER — Inpatient Hospital Stay (HOSPITAL_BASED_OUTPATIENT_CLINIC_OR_DEPARTMENT_OTHER): Payer: Medicaid Other

## 2019-06-06 DIAGNOSIS — O139 Gestational [pregnancy-induced] hypertension without significant proteinuria, unspecified trimester: Secondary | ICD-10-CM | POA: Diagnosis present

## 2019-06-06 DIAGNOSIS — Z3A31 31 weeks gestation of pregnancy: Secondary | ICD-10-CM

## 2019-06-06 DIAGNOSIS — O365990 Maternal care for other known or suspected poor fetal growth, unspecified trimester, not applicable or unspecified: Secondary | ICD-10-CM

## 2019-06-06 DIAGNOSIS — O134 Gestational [pregnancy-induced] hypertension without significant proteinuria, complicating childbirth: Secondary | ICD-10-CM

## 2019-06-06 DIAGNOSIS — O365930 Maternal care for other known or suspected poor fetal growth, third trimester, not applicable or unspecified: Principal | ICD-10-CM

## 2019-06-06 LAB — CULTURE, BETA STREP (GROUP B ONLY)

## 2019-06-06 LAB — CBC
HCT: 41.2 % (ref 36.0–46.0)
Hemoglobin: 14.2 g/dL (ref 12.0–15.0)
MCH: 30.3 pg (ref 26.0–34.0)
MCHC: 34.5 g/dL (ref 30.0–36.0)
MCV: 87.8 fL (ref 80.0–100.0)
Platelets: 261 10*3/uL (ref 150–400)
RBC: 4.69 MIL/uL (ref 3.87–5.11)
RDW: 12.2 % (ref 11.5–15.5)
WBC: 10.2 10*3/uL (ref 4.0–10.5)
nRBC: 0 % (ref 0.0–0.2)

## 2019-06-06 SURGERY — Surgical Case
Anesthesia: Spinal

## 2019-06-06 MED ORDER — DIBUCAINE (PERIANAL) 1 % EX OINT: 1.0000 "application " | TOPICAL_OINTMENT | CUTANEOUS | Status: DC | PRN

## 2019-06-06 MED ORDER — NIFEDIPINE 10 MG PO CAPS
10.0000 mg | ORAL_CAPSULE | Freq: Once | ORAL | Status: AC
  Administered 2019-06-06: 10 mg via ORAL
  Filled 2019-06-06: qty 1

## 2019-06-06 MED ORDER — MORPHINE SULFATE (PF) 0.5 MG/ML IJ SOLN
INTRAMUSCULAR | Status: DC | PRN
  Administered 2019-06-06: 150 ug via INTRATHECAL

## 2019-06-06 MED ORDER — LACTATED RINGERS IV SOLN: INTRAVENOUS | Status: DC | PRN

## 2019-06-06 MED ORDER — PHENYLEPHRINE 40 MCG/ML (10ML) SYRINGE FOR IV PUSH (FOR BLOOD PRESSURE SUPPORT)
PREFILLED_SYRINGE | INTRAVENOUS | Status: AC
  Filled 2019-06-06: qty 10

## 2019-06-06 MED ORDER — MORPHINE SULFATE (PF) 0.5 MG/ML IJ SOLN
INTRAMUSCULAR | Status: AC
  Filled 2019-06-06: qty 10

## 2019-06-06 MED ORDER — SIMETHICONE 80 MG PO CHEW
80.0000 mg | CHEWABLE_TABLET | ORAL | Status: DC
  Administered 2019-06-07 – 2019-06-08 (×3): 80 mg via ORAL
  Filled 2019-06-06 (×3): qty 1

## 2019-06-06 MED ORDER — METOCLOPRAMIDE HCL 5 MG/ML IJ SOLN
INTRAMUSCULAR | Status: DC | PRN
  Administered 2019-06-06: 10 mg via INTRAVENOUS

## 2019-06-06 MED ORDER — SCOPOLAMINE 1 MG/3DAYS TD PT72
MEDICATED_PATCH | TRANSDERMAL | Status: AC
  Filled 2019-06-06: qty 1

## 2019-06-06 MED ORDER — SCOPOLAMINE 1 MG/3DAYS TD PT72: 1.0000 | MEDICATED_PATCH | Freq: Once | TRANSDERMAL | Status: DC

## 2019-06-06 MED ORDER — TETANUS-DIPHTH-ACELL PERTUSSIS 5-2.5-18.5 LF-MCG/0.5 IM SUSP: 0.5000 mL | Freq: Once | INTRAMUSCULAR | Status: DC

## 2019-06-06 MED ORDER — TRANEXAMIC ACID-NACL 1000-0.7 MG/100ML-% IV SOLN
INTRAVENOUS | Status: DC | PRN
  Administered 2019-06-06: 1000 mg via INTRAVENOUS

## 2019-06-06 MED ORDER — KETOROLAC TROMETHAMINE 30 MG/ML IJ SOLN
INTRAMUSCULAR | Status: AC
  Filled 2019-06-06: qty 1

## 2019-06-06 MED ORDER — DEXAMETHASONE SODIUM PHOSPHATE 4 MG/ML IJ SOLN
INTRAMUSCULAR | Status: AC
  Filled 2019-06-06: qty 7

## 2019-06-06 MED ORDER — KETOROLAC TROMETHAMINE 30 MG/ML IJ SOLN: 30.0000 mg | Freq: Four times a day (QID) | INTRAMUSCULAR | Status: AC | PRN

## 2019-06-06 MED ORDER — ONDANSETRON HCL 4 MG/2ML IJ SOLN
INTRAMUSCULAR | Status: AC
  Filled 2019-06-06: qty 2

## 2019-06-06 MED ORDER — FENTANYL CITRATE (PF) 100 MCG/2ML IJ SOLN
INTRAMUSCULAR | Status: DC | PRN
  Administered 2019-06-06: 15 ug via INTRATHECAL

## 2019-06-06 MED ORDER — PRENATAL MULTIVITAMIN CH
1.0000 | ORAL_TABLET | Freq: Every day | ORAL | Status: DC
  Administered 2019-06-07 – 2019-06-08 (×2): 1 via ORAL
  Filled 2019-06-06 (×2): qty 1

## 2019-06-06 MED ORDER — OXYTOCIN 40 UNITS IN NORMAL SALINE INFUSION - SIMPLE MED: 2.5000 [IU]/h | INTRAVENOUS | Status: AC

## 2019-06-06 MED ORDER — OXYCODONE HCL 5 MG PO TABS: 5.0000 mg | ORAL_TABLET | ORAL | Status: DC | PRN

## 2019-06-06 MED ORDER — FENTANYL CITRATE (PF) 100 MCG/2ML IJ SOLN: 25.0000 ug | INTRAMUSCULAR | Status: DC | PRN

## 2019-06-06 MED ORDER — DEXAMETHASONE SODIUM PHOSPHATE 4 MG/ML IJ SOLN
INTRAMUSCULAR | Status: DC | PRN
  Administered 2019-06-06: 8 mg via INTRAVENOUS

## 2019-06-06 MED ORDER — COCONUT OIL OIL: 1.0000 "application " | TOPICAL_OIL | Status: DC | PRN

## 2019-06-06 MED ORDER — ENOXAPARIN SODIUM 40 MG/0.4ML ~~LOC~~ SOLN
40.0000 mg | SUBCUTANEOUS | Status: DC
  Administered 2019-06-07 – 2019-06-08 (×2): 40 mg via SUBCUTANEOUS
  Filled 2019-06-06 (×2): qty 0.4

## 2019-06-06 MED ORDER — MENTHOL 3 MG MT LOZG
1.0000 | LOZENGE | OROMUCOSAL | Status: DC | PRN
  Administered 2019-06-07: 3 mg via ORAL
  Filled 2019-06-06: qty 9

## 2019-06-06 MED ORDER — SIMETHICONE 80 MG PO CHEW: 80.0000 mg | CHEWABLE_TABLET | ORAL | Status: DC | PRN

## 2019-06-06 MED ORDER — CEFAZOLIN SODIUM-DEXTROSE 2-3 GM-%(50ML) IV SOLR
INTRAVENOUS | Status: DC | PRN
  Administered 2019-06-06: 2 g via INTRAVENOUS

## 2019-06-06 MED ORDER — SOD CITRATE-CITRIC ACID 500-334 MG/5ML PO SOLN
ORAL | Status: AC
  Filled 2019-06-06: qty 30

## 2019-06-06 MED ORDER — DEXAMETHASONE SODIUM PHOSPHATE 4 MG/ML IJ SOLN
INTRAMUSCULAR | Status: AC
  Filled 2019-06-06: qty 1

## 2019-06-06 MED ORDER — OXYTOCIN 40 UNITS IN NORMAL SALINE INFUSION - SIMPLE MED
INTRAVENOUS | Status: AC
  Filled 2019-06-06: qty 1000

## 2019-06-06 MED ORDER — FENTANYL CITRATE (PF) 100 MCG/2ML IJ SOLN
INTRAMUSCULAR | Status: AC
  Filled 2019-06-06: qty 2

## 2019-06-06 MED ORDER — PHENYLEPHRINE HCL (PRESSORS) 10 MG/ML IV SOLN
INTRAVENOUS | Status: DC | PRN
  Administered 2019-06-06 (×2): 80 ug via INTRAVENOUS

## 2019-06-06 MED ORDER — MAGNESIUM SULFATE 40 GM/1000ML IV SOLN
2.0000 g/h | INTRAVENOUS | Status: DC
  Filled 2019-06-06: qty 1000

## 2019-06-06 MED ORDER — KETOROLAC TROMETHAMINE 30 MG/ML IJ SOLN
30.0000 mg | Freq: Four times a day (QID) | INTRAMUSCULAR | Status: AC
  Administered 2019-06-07 (×2): 30 mg via INTRAVENOUS
  Filled 2019-06-06 (×2): qty 1

## 2019-06-06 MED ORDER — LACTATED RINGERS IV SOLN: INTRAVENOUS | Status: DC

## 2019-06-06 MED ORDER — BUPIVACAINE IN DEXTROSE 0.75-8.25 % IT SOLN
INTRATHECAL | Status: DC | PRN
  Administered 2019-06-06: 1.5 mL via INTRATHECAL

## 2019-06-06 MED ORDER — PHENYLEPHRINE HCL-NACL 20-0.9 MG/250ML-% IV SOLN
INTRAVENOUS | Status: AC
  Filled 2019-06-06: qty 250

## 2019-06-06 MED ORDER — CEFAZOLIN SODIUM-DEXTROSE 2-4 GM/100ML-% IV SOLN: 2.0000 g | INTRAVENOUS | Status: DC

## 2019-06-06 MED ORDER — OXYTOCIN 10 UNIT/ML IJ SOLN
INTRAMUSCULAR | Status: DC | PRN
  Administered 2019-06-06: 40 [IU]

## 2019-06-06 MED ORDER — SENNOSIDES-DOCUSATE SODIUM 8.6-50 MG PO TABS
2.0000 | ORAL_TABLET | ORAL | Status: DC
  Administered 2019-06-07 – 2019-06-08 (×3): 2 via ORAL
  Filled 2019-06-06 (×3): qty 2

## 2019-06-06 MED ORDER — WITCH HAZEL-GLYCERIN EX PADS: 1.0000 "application " | MEDICATED_PAD | CUTANEOUS | Status: DC | PRN

## 2019-06-06 MED ORDER — ZOLPIDEM TARTRATE 5 MG PO TABS: 5.0000 mg | ORAL_TABLET | Freq: Every evening | ORAL | Status: DC | PRN

## 2019-06-06 MED ORDER — LACTATED RINGERS IV BOLUS
500.0000 mL | Freq: Once | INTRAVENOUS | Status: AC
  Administered 2019-06-06: 500 mL via INTRAVENOUS

## 2019-06-06 MED ORDER — SIMETHICONE 80 MG PO CHEW
80.0000 mg | CHEWABLE_TABLET | Freq: Three times a day (TID) | ORAL | Status: DC
  Administered 2019-06-07 – 2019-06-08 (×5): 80 mg via ORAL
  Filled 2019-06-06 (×5): qty 1

## 2019-06-06 MED ORDER — OXYCODONE-ACETAMINOPHEN 5-325 MG PO TABS
2.0000 | ORAL_TABLET | Freq: Once | ORAL | Status: AC
  Administered 2019-06-06: 2 via ORAL
  Filled 2019-06-06: qty 2

## 2019-06-06 MED ORDER — ONDANSETRON HCL 4 MG/2ML IJ SOLN
INTRAMUSCULAR | Status: DC | PRN
  Administered 2019-06-06: 4 mg via INTRAVENOUS

## 2019-06-06 MED ORDER — METOCLOPRAMIDE HCL 5 MG/ML IJ SOLN
INTRAMUSCULAR | Status: AC
  Filled 2019-06-06: qty 2

## 2019-06-06 MED ORDER — ACETAMINOPHEN 325 MG PO TABS
650.0000 mg | ORAL_TABLET | Freq: Four times a day (QID) | ORAL | Status: DC | PRN
  Administered 2019-06-08 (×2): 650 mg via ORAL
  Filled 2019-06-06 (×2): qty 2

## 2019-06-06 MED ORDER — TRANEXAMIC ACID-NACL 1000-0.7 MG/100ML-% IV SOLN
INTRAVENOUS | Status: AC
  Filled 2019-06-06: qty 100

## 2019-06-06 MED ORDER — IBUPROFEN 800 MG PO TABS
800.0000 mg | ORAL_TABLET | Freq: Four times a day (QID) | ORAL | Status: DC
  Administered 2019-06-07 – 2019-06-09 (×7): 800 mg via ORAL
  Filled 2019-06-06 (×8): qty 1

## 2019-06-06 MED ORDER — SODIUM CHLORIDE 0.9 % IV SOLN: INTRAVENOUS | Status: DC | PRN

## 2019-06-06 MED ORDER — MAGNESIUM SULFATE BOLUS VIA INFUSION
4.0000 g | Freq: Once | INTRAVENOUS | Status: AC
  Administered 2019-06-06: 4 g via INTRAVENOUS
  Filled 2019-06-06: qty 1000

## 2019-06-06 MED ORDER — SCOPOLAMINE 1 MG/3DAYS TD PT72
MEDICATED_PATCH | TRANSDERMAL | Status: DC | PRN
  Administered 2019-06-06: 1 via TRANSDERMAL

## 2019-06-06 MED ORDER — KETOROLAC TROMETHAMINE 30 MG/ML IJ SOLN
30.0000 mg | Freq: Four times a day (QID) | INTRAMUSCULAR | Status: AC | PRN
  Administered 2019-06-06: 30 mg via INTRAVENOUS

## 2019-06-06 MED ORDER — DIPHENHYDRAMINE HCL 25 MG PO CAPS: 25.0000 mg | ORAL_CAPSULE | Freq: Four times a day (QID) | ORAL | Status: DC | PRN

## 2019-06-06 MED ORDER — CEFAZOLIN SODIUM-DEXTROSE 2-4 GM/100ML-% IV SOLN
2.0000 g | INTRAVENOUS | Status: DC
  Filled 2019-06-06: qty 100

## 2019-06-06 MED ORDER — PHENYLEPHRINE HCL-NACL 20-0.9 MG/250ML-% IV SOLN
INTRAVENOUS | Status: DC | PRN
  Administered 2019-06-06: 40 ug/min via INTRAVENOUS

## 2019-06-06 SURGICAL SUPPLY — 42 items
BENZOIN TINCTURE PRP APPL 2/3 (GAUZE/BANDAGES/DRESSINGS) ×3 IMPLANT
CHLORAPREP W/TINT 26ML (MISCELLANEOUS) ×3 IMPLANT
CLAMP CORD UMBIL (MISCELLANEOUS) IMPLANT
CLOSURE WOUND 1/2 X4 (GAUZE/BANDAGES/DRESSINGS) ×1
CLOTH BEACON ORANGE TIMEOUT ST (SAFETY) ×3 IMPLANT
DRAPE C SECTION CLR SCREEN (DRAPES) IMPLANT
DRSG OPSITE POSTOP 4X10 (GAUZE/BANDAGES/DRESSINGS) ×3 IMPLANT
ELECT REM PT RETURN 9FT ADLT (ELECTROSURGICAL) ×3
ELECTRODE REM PT RTRN 9FT ADLT (ELECTROSURGICAL) ×1 IMPLANT
EXTRACTOR VACUUM M CUP 4 TUBE (SUCTIONS) IMPLANT
EXTRACTOR VACUUM M CUP 4' TUBE (SUCTIONS)
GAUZE SPONGE 4X4 12PLY STRL (GAUZE/BANDAGES/DRESSINGS) ×4 IMPLANT
GLOVE BIO SURGEON STRL SZ7.5 (GLOVE) ×3 IMPLANT
GLOVE BIOGEL PI IND STRL 7.0 (GLOVE) ×1 IMPLANT
GLOVE BIOGEL PI INDICATOR 7.0 (GLOVE) ×2
GOWN STRL REUS W/TWL 2XL LVL3 (GOWN DISPOSABLE) ×3 IMPLANT
GOWN STRL REUS W/TWL LRG LVL3 (GOWN DISPOSABLE) ×6 IMPLANT
KIT ABG SYR 3ML LUER SLIP (SYRINGE) IMPLANT
NDL HYPO 25X5/8 SAFETYGLIDE (NEEDLE) IMPLANT
NEEDLE HYPO 22GX1.5 SAFETY (NEEDLE) ×3 IMPLANT
NEEDLE HYPO 25X5/8 SAFETYGLIDE (NEEDLE) IMPLANT
NS IRRIG 1000ML POUR BTL (IV SOLUTION) ×3 IMPLANT
PACK C SECTION WH (CUSTOM PROCEDURE TRAY) ×3 IMPLANT
PAD OB MATERNITY 4.3X12.25 (PERSONAL CARE ITEMS) ×3 IMPLANT
PENCIL SMOKE EVAC W/HOLSTER (ELECTROSURGICAL) ×3 IMPLANT
RTRCTR C-SECT PINK 25CM LRG (MISCELLANEOUS) ×3 IMPLANT
STRIP CLOSURE SKIN 1/2X4 (GAUZE/BANDAGES/DRESSINGS) ×2 IMPLANT
SUT CHROMIC 1 CTX 36 (SUTURE) ×6 IMPLANT
SUT VIC AB 1 CT1 36 (SUTURE) ×6 IMPLANT
SUT VIC AB 2-0 CT1 (SUTURE) ×3 IMPLANT
SUT VIC AB 2-0 CT1 27 (SUTURE) ×2
SUT VIC AB 2-0 CT1 TAPERPNT 27 (SUTURE) ×1 IMPLANT
SUT VIC AB 3-0 CT1 27 (SUTURE) ×4
SUT VIC AB 3-0 CT1 TAPERPNT 27 (SUTURE) ×2 IMPLANT
SUT VIC AB 3-0 SH 27 (SUTURE)
SUT VIC AB 3-0 SH 27X BRD (SUTURE) IMPLANT
SUT VIC AB 4-0 KS 27 (SUTURE) ×3 IMPLANT
SYR BULB IRRIGATION 50ML (SYRINGE) IMPLANT
TAPE HYPAFIX 4 X10 (GAUZE/BANDAGES/DRESSINGS) ×2 IMPLANT
TOWEL OR 17X24 6PK STRL BLUE (TOWEL DISPOSABLE) ×3 IMPLANT
TRAY FOLEY W/BAG SLVR 14FR LF (SET/KITS/TRAYS/PACK) ×3 IMPLANT
WATER STERILE IRR 1000ML POUR (IV SOLUTION) ×3 IMPLANT

## 2019-06-06 NOTE — Progress Notes (Signed)
Report given to [facility] OR Nurse and CRNA.

## 2019-06-06 NOTE — Discharge Summary (Signed)
Postpartum Discharge Summary     Patient Name: Emily Welch DOB: 2000-02-20 MRN: [id number]  Date of admission: 06/04/2019 Delivery date:06/06/2019  Delivering provider: Woodroe Mode  Date of discharge: 06/09/2019  Admitting diagnosis: IUGR (intrauterine growth restriction) affecting care of mother [O36.5990] Intrauterine pregnancy: [redacted]w[redacted]d    Secondary diagnosis:  Principal Problem:   Postpartum care following cesarean delivery Active Problems:   Rh negative status during pregnancy   Chlamydia infection affecting pregnancy in second trimester   Major depressive disorder with current active episode   IUGR (intrauterine growth restriction) affecting care of mother   Gestational hypertension   Nexplanon insertion  Additional problems: None    Discharge diagnosis: Preterm Pregnancy Delivered and Gestational Hypertension                                              Post partum procedures:Nexplanon insertion Augmentation: N/A Complications: None  Hospital course: Sceduled C/S   19y.o. yo G1P0101 at 353w2das admitted to the hospital 06/04/2019 for elevated blood pressures and newly diagnosed FGR and AEDF. [facility] and [facility]esium for neuroprotection. Repeat dopplers on 5/13 showed persistent reversed end diastolic flow. MFM was consulted and recommended delivery by cesarean section. Delivery details are as follows:  Membrane Rupture Time/Date: 4:28 PM ,06/06/2019   Delivery Method:C-Section, Low Transverse  Details of operation can be found in separate operative note.  Patient had an uncomplicated postpartum course. BP's monitored and were stable after being started on Norvasc 10 mg daily.  Nexplanon inserted on POD#2. She is ambulating, tolerating a regular diet, passing flatus, and urinating well. Patient is discharged home in stable condition on  06/09/19        Newborn Data: Birth date:06/06/2019  Birth time:4:28 PM  Gender:Female   Living status:Living  Apgars:9 ,9     Magnesium Sulfate received: Yes: Neuroprotection BMZ received: Yes Rhophylac:Yes MMR:No T-DaP:Given prenatally Flu: Yes Transfusion:No  Physical exam  Vitals:   06/08/19 1450 06/08/19 1911 06/08/19 2318 06/09/19 0613  BP: 116/62 (!) 109/48 125/72 126/63  Pulse: 87 78 76 (!) 59  Resp: '18 16 18 19  ' Temp: 98.2 F (36.8 C) 98.4 F (36.9 C) 97.9 F (36.6 C) 97.7 F (36.5 C)  TempSrc: Oral Oral Oral Oral  SpO2: 100% 98% 100% 100%  Weight:      Height:       General: alert, cooperative and no distress Lochia: appropriate Uterine Fundus: firm Incision: Healing well with no significant drainage, No significant erythema, Dressing is clean, dry, and intact DVT Evaluation: No evidence of DVT seen on physical exam. Negative Homan's sign. No cords or calf tenderness. No significant calf/ankle edema. Labs: Lab Results  Component Value Date   WBC 13.5 (H) 06/07/2019   HGB 12.6 06/07/2019   HCT 36.7 06/07/2019   MCV 88.0 06/07/2019   PLT 226 06/07/2019   CMP Latest Ref Rng & Units 06/04/2019  Glucose 70 - 99 mg/dL 82  BUN 6 - 20 mg/dL 8  Creatinine 0.44 - 1.00 mg/dL 0.71  Sodium 135 - 145 mmol/L 137  Potassium 3.5 - 5.1 mmol/L 4.0  Chloride 98 - 111 mmol/L 105  CO2 22 - 32 mmol/L 22  Calcium 8.9 - 10.3 mg/dL 8.7(L)  Total Protein 6.5 - 8.1 g/dL 5.8(L)  Total Bilirubin 0.3 - 1.2 mg/dL 0.8  Alkaline  Phos 38 - 126 U/L 102  AST 15 - 41 U/L 15  ALT 0 - 44 U/L 17   Edinburgh Score: No flowsheet data found.    After visit meds:  Allergies as of 06/09/2019   No Known Allergies     Medication List    STOP taking these medications   cyclobenzaprine 10 MG tablet Commonly known as: FLEXERIL   DICLEGIS PO     TAKE these medications   acetaminophen 500 MG tablet Commonly known as: TYLENOL Take 1,000 mg by mouth every 6 (six) hours as needed for mild pain or headache.   amLODipine 10 MG tablet Commonly known as: NORVASC Take 1  tablet (10 mg total) by mouth daily.   Blood Pressure Monitor Automat Devi 1 Device by Does not apply route daily. Automatic Blood pressure cuff regular or large size. Monitor blood pressure regularly at home. ICD-10 Code: O09.90   docusate sodium 100 MG capsule Commonly known as: COLACE Take 1 capsule (100 mg total) by mouth 2 (two) times daily as needed for mild constipation or moderate constipation.   FLUoxetine 40 MG capsule Commonly known as: PROzac Take 1 capsule (40 mg total) by mouth daily.   ibuprofen 800 MG tablet Commonly known as: ADVIL Take 1 tablet (800 mg total) by mouth 3 (three) times daily with meals as needed for headache, moderate pain or cramping.   oxyCODONE 5 MG immediate release tablet Commonly known as: Oxy IR/ROXICODONE Take 1 tablet (5 mg total) by mouth every 4 (four) hours as needed for severe pain.   Prenatal Adult Gummy/DHA/FA 0.4-25 MG Chew Chew 1 tablet by mouth daily.            Discharge Care Instructions  (From admission, onward)         Start     Ordered   06/09/19 0000  Discharge wound care:    Comments: As per discharge handout and nursing instructions   06/09/19 0730           Discharge home in stable condition Infant Feeding: Breast Infant Disposition:NICU Discharge instruction: per After Visit Summary and Postpartum booklet. Activity: Advance as tolerated. Pelvic rest for 6 weeks.  Diet: routine diet Anticipated Birth Control: Nexplanon Postpartum Appointment:4 weeks Additional Postpartum F/U: BP check 1 week and mood check and incision check  Follow up Visit: Follow-up Information    [facility] up in 1 week(s).   Specialty: Obstetrics and Gynecology Why: Incision check and BP check. Patient will be contacted with appointment details. Contact information: [place] [address] [phone]              06/09/2019 Verita Schneiders, MD

## 2019-06-06 NOTE — Progress Notes (Signed)
Dr. Parke Poisson reports that delivery is indicated for persistent reverse umbilical artery flow on dopplers. I discussed with Dr. Alysia Penna who is on call for L&D. Due to the clinical situation with occasional decelerations she will have a cesarean section and we will start magnesium sulfate.  The risks of cesarean section discussed with the patient included but were not limited to: bleeding which may require transfusion or reoperation; infection which may require antibiotics; injury to bowel, bladder, ureters or other surrounding organs; injury to the fetus; need for additional procedures including hysterectomy in the event of a life-threatening hemorrhage; placental abnormalities wth subsequent pregnancies, incisional problems, thromboembolic phenomenon and other postoperative/anesthesia complications. The patient concurred with the proposed plan, giving informed written consent for the procedure.   Patient has been NPO since 0800 she will remain NPO for procedure. Anesthesia and OR aware. Preoperative prophylactic antibiotics and SCDs ordered on call to the OR.  To OR when ready.  Patient ID: Emily Welch, female   DOB: 27-Jul-2000, 19 y.o.   MRN: [id number]

## 2019-06-06 NOTE — Progress Notes (Signed)
FACULTY PRACTICE ANTEPARTUM(COMPREHENSIVE) NOTE  Emily Welch is a 19 y.o. G1P0 at [redacted]w[redacted]d by early ultrasound who is admitted for IUGR. Oligohydramnios and abnl dopplers.   Fetal presentation is cephalic. Length of Stay:  2  Days  Subjective:  Patient reports the fetal movement as active. Patient reports uterine contraction  activity as none. Patient reports  vaginal bleeding as none. Patient describes fluid per vagina as None.  Vitals:  Blood pressure 137/78, pulse 69, temperature 98 F (36.7 C), temperature source Oral, resp. rate 18, height 5' (1.524 m), weight 75.1 kg, last menstrual period 10/20/2018, SpO2 99 %, unknown if currently breastfeeding. Physical Examination:  General appearance - alert, well appearing, and in no distress Heart - normal rate and regular rhythm Abdomen - soft, nontender, nondistended Fundal Height:  size equals dates Cervical Exam: Not evaluated. Extremities: extremities normal, atraumatic, no cyanosis or edema and Homans sign is negative  Membranes:intact  Fetal Monitoring:     Fetal Heart Rate A  Mode External filed at 06/06/2019 0532  Baseline Rate (A) 125 bpm filed at 06/06/2019 0532  Variability 6-25 BPM filed at 06/06/2019 0532  Accelerations 10 x 10 filed at 06/06/2019 0532  Decelerations None filed at 06/06/2019 0532     Labs:  No results found for this or any previous visit (from the past 24 hour(s)).   Medications:  Scheduled . docusate sodium  100 mg Oral Daily  . prenatal multivitamin  1 tablet Oral Q1200   I have reviewed the patient's current medications.  ASSESSMENT: Patient Active Problem List   Diagnosis Date Noted  . IUGR (intrauterine growth restriction) affecting care of mother 06/04/2019  . Major depressive disorder with current active episode 02/27/2019  . Rh negative status during pregnancy 02/05/2019  . Chlamydia infection affecting pregnancy in second trimester 02/05/2019  . Supervision of normal first  pregnancy, antepartum 01/08/2019  . Bacterial vaginosis 12/03/2018  . Intrauterine pregnancy 12/03/2018  . Morning sickness 12/03/2018    PLAN: Fetal dopplers and BPP with fetal decels noted overnight  Scheryl Darter 06/06/2019,9:51 AM

## 2019-06-06 NOTE — Consult Note (Signed)
MFM Note  This patient has been hospitalized due to severe fetal growth restriction with intermittent reversed end-diastolic flow.  She has already received a complete course of antenatal corticosteroids.    Doppler studies of the umbilical arteries performed today continues to show intermittent reversed end-diastolic flow.  Low normal amniotic fluid continues to be noted.    A biophysical profile performed today was 8 out of 8.    I reviewed the patient's fetal heart rate tracing from overnight. She had a few repetitive variable decelerations.  Her fetal heart rate tracing is currently reactive for her gestational age.    In light of the reversed end-diastolic flow noted on her umbilical artery stop Doppler studies along with the repetitive variable decelerations that were noted overnight, I would recommend delivery at this time.    Due to fetal growth restriction at her current gestational age, she should receive magnesium sulfate for fetal neuroprotection prior to delivery.

## 2019-06-06 NOTE — Anesthesia Preprocedure Evaluation (Signed)
Anesthesia Evaluation  Patient identified by MRN, date of birth, ID band Patient awake    Reviewed: Allergy & Precautions, NPO status , Patient's Chart, lab work & pertinent test results  Airway Mallampati: II  TM Distance: >3 FB     Dental  (+) Dental Advisory Given   Pulmonary asthma , former smoker,    breath sounds clear to auscultation       Cardiovascular negative cardio ROS   Rhythm:Regular Rate:Normal     Neuro/Psych negative neurological ROS     GI/Hepatic negative GI ROS, Neg liver ROS,   Endo/Other  negative endocrine ROS  Renal/GU negative Renal ROS     Musculoskeletal   Abdominal   Peds  Hematology negative hematology ROS (+)   Anesthesia Other Findings   Reproductive/Obstetrics (+) Pregnancy (G1 at 31WGA with severe IUGR for c/section 2/2 )                             Lab Results  Component Value Date   WBC 6.1 06/04/2019   HGB 13.5 06/04/2019   HCT 39.0 06/04/2019   MCV 87.2 06/04/2019   PLT 218 06/04/2019   Lab Results  Component Value Date   CREATININE 0.71 06/04/2019   BUN 8 06/04/2019   NA 137 06/04/2019   K 4.0 06/04/2019   CL 105 06/04/2019   CO2 22 06/04/2019    Anesthesia Physical Anesthesia Plan  ASA: II  Anesthesia Plan: Spinal   Post-op Pain Management:    Induction:   PONV Risk Score and Plan: 2 and Dexamethasone, Ondansetron and Treatment may vary due to age or medical condition  Airway Management Planned: Natural Airway  Additional Equipment: None  Intra-op Plan:   Post-operative Plan:   Informed Consent: I have reviewed the patients History and Physical, chart, labs and discussed the procedure including the risks, benefits and alternatives for the proposed anesthesia with the patient or authorized representative who has indicated his/her understanding and acceptance.       Plan Discussed with: CRNA  Anesthesia Plan  Comments:         Anesthesia Quick Evaluation

## 2019-06-06 NOTE — Anesthesia Procedure Notes (Signed)
Spinal  Patient location during procedure: OR Start time: 06/06/2019 4:00 PM End time: 06/06/2019 4:07 PM Staffing Performed: anesthesiologist  Anesthesiologist: Marcene Duos, MD Preanesthetic Checklist Completed: patient identified, IV checked, site marked, risks and benefits discussed, surgical consent, monitors and equipment checked, pre-op evaluation and timeout performed Spinal Block Patient position: sitting Prep: DuraPrep Patient monitoring: heart rate, cardiac monitor, continuous pulse ox and blood pressure Approach: midline Location: L3-4 Injection technique: single-shot Needle Needle type: Pencan  Needle gauge: 24 G Needle length: 9 cm Assessment Sensory level: T4

## 2019-06-06 NOTE — Op Note (Signed)
Emily Welch PROCEDURE DATE: 06/06/2019  PREOPERATIVE DIAGNOSES: Intrauterine pregnancy at [redacted]w[redacted]d weeks gestation; oligohydramnios and severe fetal growth restriction, persistent reveresed end diastolic flow  POSTOPERATIVE DIAGNOSES: The same  PROCEDURE: Primary Low Transverse Cesarean Section  SURGEON:  Dr. Scheryl Darter - Primary Dr. Jerilynn Birkenhead - Fellow  ANESTHESIOLOGY TEAM: Anesthesiologist: Marcene Duos, MD CRNA: Graciela Husbands, CRNA  INDICATIONS: Emily Welch is a 19 y.o. G1P0101 at [redacted]w[redacted]d here for cesarean section secondary to the indications listed under preoperative diagnoses; please see preoperative note for further details.  The risks of cesarean section were discussed with the patient including but were not limited to: bleeding which may require transfusion or reoperation; infection which may require antibiotics; injury to bowel, bladder, ureters or other surrounding organs; injury to the fetus; need for additional procedures including hysterectomy in the event of a life-threatening hemorrhage; placental abnormalities wth subsequent pregnancies, incisional problems, thromboembolic phenomenon and other postoperative/anesthesia complications.   The patient concurred with the proposed plan, giving informed written consent for the procedure.    FINDINGS:  Viable female infant in cephalic presentation.  Clear amniotic fluid.  Intact placenta, three vessel cord.  Normal uterus, fallopian tubes and ovaries bilaterally. APGAR (1 MIN): 9   APGAR (5 MINS): 9   APGAR (10 MINS):    ANESTHESIA: Spinal INTRAVENOUS FLUIDS: 1360 ml   ESTIMATED BLOOD LOSS: 209 ml URINE OUTPUT:  475 ml SPECIMENS: Placenta sent to pathology COMPLICATIONS: None immediate  PROCEDURE IN DETAIL:  The patient preoperatively received intravenous antibiotics and had sequential compression devices applied to her lower extremities.  She was then taken to the operating room where spinal anesthesia was  administered and was found to be adequate. She was then placed in a dorsal supine position with a leftward tilt, and prepped and draped in a sterile manner.  A foley catheter was placed into her bladder and attached to constant gravity.  After an adequate timeout was performed, a Pfannenstiel skin incision was made with scalpel and carried through to the underlying layer of fascia. The fascia was incised in the midline, and this incision was extended bilaterally using blunt traction. The rectus muscles were separated in the midline and the peritoneum was entered bluntly. The Alexis self-retaining retractor was introduced into the abdominal cavity.  Attention was turned to the lower uterine segment where a low transverse hysterotomy was made with a scalpel and extended bilaterally bluntly.  The infant was successfully delivered, the cord was clamped and cut prior to one minute per NICU preference and the infant was handed over to the awaiting neonatology team. Uterine massage was then administered, and the placenta delivered intact with a three-vessel cord. The uterus was then cleared of clots and debris. TXA given due to oozing. The hysterotomy was closed with 1 Chromic in a running locked fashion, and an imbricating layer was also placed with 1 Chromic. The pelvis was cleared of all clot and debris. Hemostasis was confirmed on all surfaces.  The retractor was removed.  The peritoneum was closed with a 2-0 Vicryl running stitch. The fascia was then closed using 1 Vicryl in a running fashion.  The subcutaneous layer was irrigated and the skin was closed with a 4-0 Vicryl subcuticular stitch. The patient tolerated the procedure well. Sponge, instrument and needle counts were correct x 3.  She was taken to the recovery room in stable condition.   Jerilynn Birkenhead, MD [facility] Fellow, [facility] for [employer], [facility]

## 2019-06-06 NOTE — Transfer of Care (Signed)
Immediate Anesthesia Transfer of Care Note  Patient: Emily Welch  Procedure(s) Performed: CESAREAN SECTION (N/A )  Patient Location: PACU  Anesthesia Type:Spinal  Level of Consciousness: awake, alert  and oriented  Airway & Oxygen Therapy: Patient Spontanous Breathing  Post-op Assessment: Report given to RN and Post -op Vital signs reviewed and stable  Post vital signs: Reviewed and stable  Last Vitals:  Vitals Value Taken Time  BP 132/80 06/06/19 1711  Temp    Pulse 69 06/06/19 1715  Resp 18 06/06/19 1715  SpO2 98 % 06/06/19 1715  Vitals shown include unvalidated device data.  Last Pain:  Vitals:   06/06/19 1713  TempSrc: (P) Oral  PainSc: (P) 0-No pain      Patients Stated Pain Goal: 3 (06/06/19 1143)  Complications: No apparent anesthesia complications

## 2019-06-07 LAB — CBC
HCT: 36.7 % (ref 36.0–46.0)
Hemoglobin: 12.6 g/dL (ref 12.0–15.0)
MCH: 30.2 pg (ref 26.0–34.0)
MCHC: 34.3 g/dL (ref 30.0–36.0)
MCV: 88 fL (ref 80.0–100.0)
Platelets: 226 10*3/uL (ref 150–400)
RBC: 4.17 MIL/uL (ref 3.87–5.11)
RDW: 11.9 % (ref 11.5–15.5)
WBC: 13.5 10*3/uL — ABNORMAL HIGH (ref 4.0–10.5)
nRBC: 0 % (ref 0.0–0.2)

## 2019-06-07 MED ORDER — AMLODIPINE BESYLATE 5 MG PO TABS
5.0000 mg | ORAL_TABLET | Freq: Every day | ORAL | Status: DC
  Administered 2019-06-07: 5 mg via ORAL
  Filled 2019-06-07: qty 1

## 2019-06-07 NOTE — Lactation Note (Signed)
This note was copied from a baby's chart. Lactation Consultation Note  Patient Name: Emily Welch [id number] Date: 06/07/2019 Reason for consult: Initial assessment;Preterm <34wks;Infant < 6lbs;1st time breastfeeding;Primapara;NICU baby  LC requested to see Mom about her flange size while pumping.  Mom had been given a 27 mm flange, but states that she has pain with pumping.  30 mm flange given and had Mom demonstrate pumping.  Reminded her of importance of centering her nipple when placing flanges on.  Nipple and some areola pulling when pumping.  Mom reports that this flange size was more comfortable for her.   Disassembled pump parts, washed, rinsed and placed in separate bin to air dry.   Encouraged pumping every 2-3 hrs during day and 3-4 hrs at night. Praised Mom for providing her breast milk to her baby.    Lactation Tools Discussed/Used Pump Review: Setup, frequency, and cleaning;Milk Storage(Reviewed)   Consult Status Consult Status: Follow-up Date: 06/08/19 Follow-up type: In-patient    Emily Welch 06/07/2019, 1:15 PM

## 2019-06-07 NOTE — Progress Notes (Signed)
Subjective: Postpartum Day 1: Cesarean Delivery Patient reports incisional pain, tolerating PO and no problems voiding.    Objective: Vital signs in last 24 hours: Temp:  [96 F (35.6 C)-98.3 F (36.8 C)] 98.3 F (36.8 C) (05/14 0820) Pulse Rate:  [52-87] 54 (05/14 0859) Resp:  [16-22] 18 (05/14 0820) BP: (100-157)/(57-106) 149/90 (05/14 0859) SpO2:  [97 %-100 %] 100 % (05/14 0859)  Physical Exam:  General: alert, cooperative and no distress Lochia: appropriate Uterine Fundus: firm Incision: healing well, no significant drainage DVT Evaluation: No evidence of DVT seen on physical exam.  Recent Labs    06/06/19 1342 06/07/19 0437  HGB 14.2 12.6  HCT 41.2 36.7    Assessment/Plan: Status post Cesarean section. Doing well postoperatively.  BP noted, will add Norvasc for control.  Scheryl Darter 06/07/2019, 9:55 AM

## 2019-06-07 NOTE — Lactation Note (Signed)
This note was copied from a baby's chart. Lactation Consultation Note  Patient Name: [name] [id number] Date: 06/07/2019 Reason for consult: Initial assessment;Preterm <34wks;Infant < 6lbs;1st time breastfeeding;Primapara;NICU baby  P1 mother whose infant is now 82 hours old.  This is a preterm infant at 31+2 weeks with a CGA of 31+3 weeks, weighing <3 lbs and in the NICU.  Mother had the DEBP set up in her room and informed me that she has not pumped since around midnight last night.  She did not have any bottle to collect her EBM in and was waiting for the nurse to bring her some.  Reviewed pump parts and basic pumping information with mother this morning.  Assess the #24 flange size to be too small and changed her to a #27 flange size.  Observed mother pumping for the entire 15 minutes while continuing to provide further education.  At the end of her pumping session mother was able to obtain 25 mls of EBM.  Provided praise and emotional support for a job well done.  Reminded mother how valuable her breast milk is for her preterm baby.  Mother smiled and was happy to see so much milk already.  Labels printed and in the NICU.  Mother will obtain the labels when she visits her baby this morning.  Informed her about pumping at baby's bedside and how to transport pump pieces to NICU.  Mother has a DEBP for home use and has brought this pump to the hospital.  Suggested she use this pump at home and continue to use the hospital grade pump during her stay here.  Mother verbalized understanding.  Father of baby present and mother has a good support system at home with her mother and siblings.  She will call for further questions/concerns.   Maternal Data Formula Feeding for Exclusion: No Has patient been taught Hand Expression?: Yes Does the patient have breastfeeding experience prior to this delivery?: No  Feeding    LATCH Score                   Interventions     Lactation Tools Discussed/Used Pump Review: Setup, frequency, and cleaning;Milk Storage(Reviewed)   Consult Status Consult Status: Follow-up Date: 06/08/19 Follow-up type: In-patient    Emily Welch Emily Welch 06/07/2019, 9:55 AM

## 2019-06-07 NOTE — Progress Notes (Addendum)
Cytology called RN to notify that GC/Chlamydia swab was not able to be collected/processed.

## 2019-06-07 NOTE — Anesthesia Postprocedure Evaluation (Signed)
Anesthesia Post Note  Patient: Emily Welch  Procedure(s) Performed: CESAREAN SECTION (N/A )     Patient location during evaluation: PACU Anesthesia Type: Spinal Level of consciousness: awake and alert Pain management: pain level controlled Vital Signs Assessment: post-procedure vital signs reviewed and stable Respiratory status: spontaneous breathing and respiratory function stable Cardiovascular status: blood pressure returned to baseline and stable Postop Assessment: spinal receding Anesthetic complications: no    Last Vitals:  Vitals:   06/07/19 0859 06/07/19 1120  BP: (!) 149/90 (!) 146/95  Pulse: (!) 54 (!) 55  Resp:  18  Temp:  36.8 C  SpO2: 100% 100%    Last Pain:  Vitals:   06/07/19 1120  TempSrc: Oral  PainSc:                  Kennieth Rad

## 2019-06-07 NOTE — Progress Notes (Signed)
CSW attempted to meet with MOB in room 118 to complete a clinical assessment. When CSW arrived, MOB was not present and CSW was informed that MOB was visiting infant in the NICU. CSW went to room 343 and when CSW arrived, MOB was asleep on the couch. MOB was easily awaken however, she requested that CSW return at a later time. MOB agreed to meet with CSW on Monday(5/17) when MOB is visiting with infant.  CSW will continue to offer resources and supports to family while infant remains in NICU.   Blaine Hamper, MSW, LCSW Clinical Social Work [phone]

## 2019-06-08 DIAGNOSIS — Z30017 Encounter for initial prescription of implantable subdermal contraceptive: Secondary | ICD-10-CM

## 2019-06-08 MED ORDER — LIDOCAINE HCL 1 % IJ SOLN
0.0000 mL | Freq: Once | INTRAMUSCULAR | Status: AC | PRN
  Administered 2019-06-08: 20 mL via INTRADERMAL
  Filled 2019-06-08: qty 20

## 2019-06-08 MED ORDER — ETONOGESTREL 68 MG ~~LOC~~ IMPL
68.0000 mg | DRUG_IMPLANT | Freq: Once | SUBCUTANEOUS | Status: AC
  Administered 2019-06-08: 68 mg via SUBCUTANEOUS
  Filled 2019-06-08 (×2): qty 1

## 2019-06-08 MED ORDER — AMLODIPINE BESYLATE 10 MG PO TABS
10.0000 mg | ORAL_TABLET | Freq: Every day | ORAL | Status: DC
  Administered 2019-06-08: 10 mg via ORAL
  Filled 2019-06-08: qty 1

## 2019-06-08 NOTE — Progress Notes (Signed)
Subjective: Postpartum Day 2: Cesarean Delivery Patient reports incisional pain, tolerating PO and no problems voiding.    Objective: Vital signs in last 24 hours: Temp:  [98.2 F (36.8 C)-98.4 F (36.9 C)] 98.3 F (36.8 C) (05/15 0417) Pulse Rate:  [51-74] 74 (05/15 0417) Resp:  [18-20] 18 (05/15 0417) BP: (116-181)/(67-99) 116/82 (05/15 0417) SpO2:  [99 %-100 %] 100 % (05/15 0417)  Physical Exam:  General: alert, cooperative and appears stated age Lochia: appropriate Uterine Fundus: firm Incision: no significant drainage DVT Evaluation: No evidence of DVT seen on physical exam.  Recent Labs    06/06/19 1342 06/07/19 0437  HGB 14.2 12.6  HCT 41.2 36.7    Assessment/Plan: Status post Cesarean section. Doing well postoperatively.  Still with elevated BPs yesterday Increased Norvasc to 10 mg today Needs Nexplanon - will place today Wants circ, R/B/A reviewed Breast feeding Anticipate D/C in am if BPs improved. Continue current care.  Reva Bores 06/08/2019, 7:10 AM

## 2019-06-08 NOTE — Lactation Note (Signed)
This note was copied from a baby's chart. Lactation Consultation Note  Patient Name: Boy Berenise Hunton [id number] Date: 06/08/2019  Mom laying down on arrival.  Mom reports pumping is going well.  Mom has several containers of Expressed breastmilk that she has not taken to NICU. Mom reports she has really been resting today.  Let her know if it was okay I would take her milk to her baby Salley Slaughter in the NICU when I left her room.  Mom reports she has a pump for home use but it was a friends and she would like to get a pump like she is using.  Mom on [facility] in [place].  [facility] referral sent.  Mom has condensation in her tubing.  Showed mom how to get condensation out of her tubing,  Urged her to let it run at least once a day for 15 minutes past pumping and anytime she sees condensation in there.  Mom reports pumping every 3 hours for 15 minutes.  Mom reports no breast or nipple soreness. Urged mom to call lactation as needed.  Took Zion's breastmilk to NICU, gave to his RN.  Urged mom to call lactation as needed.     Maternal Data    Feeding Feeding Type: Breast Milk  LATCH Score                   Interventions    Lactation Tools Discussed/Used     Consult Status      Osvaldo Lamping Michaelle Copas 06/08/2019, 1:55 PM

## 2019-06-08 NOTE — Procedures (Signed)
    GYNECOLOGY PROCEDURE NOTE  Emily Welch is a 19 y.o. G1P0101 here for Nexplanon insertion.    Nexplanon Insertion Procedure Patient identified, informed consent performed, consent signed.   Patient does understand that irregular bleeding is a very common side effect of this medication. She was advised to have backup contraception for one week after placement.  Appropriate time out taken.  Patient's left arm was prepped and draped in the usual sterile fashion. The ruler used to measure and mark insertion area.  Patient was prepped with alcohol swab and then injected with 3 ml of 1% lidocaine.  She was prepped with betadine, Nexplanon removed from packaging,  Device confirmed in needle, then inserted full length of needle and withdrawn per handbook instructions. Nexplanon was able to palpated in the patient's arm; patient palpated the insert herself. There was minimal blood loss.  Patient insertion site covered with guaze and a pressure bandage to reduce any bruising.  The patient tolerated the procedure well and was given post procedure instructions.   Clayton Bibles, MSN, CNM Certified Nurse Midwife, [facility] for [employer], [facility] 06/08/19 11:09 AM

## 2019-06-09 MED ORDER — IBUPROFEN 800 MG PO TABS: 800.0000 mg | ORAL_TABLET | Freq: Three times a day (TID) | ORAL | 2 refills | Status: DC | PRN

## 2019-06-09 MED ORDER — OXYCODONE HCL 5 MG PO TABS: 5.0000 mg | ORAL_TABLET | ORAL | 0 refills | Status: AC | PRN

## 2019-06-09 MED ORDER — DOCUSATE SODIUM 100 MG PO CAPS
100.0000 mg | ORAL_CAPSULE | Freq: Two times a day (BID) | ORAL | 2 refills | Status: AC | PRN
Start: 2019-06-09 — End: ?

## 2019-06-09 MED ORDER — AMLODIPINE BESYLATE 10 MG PO TABS: 10.0000 mg | ORAL_TABLET | Freq: Every day | ORAL | 2 refills | Status: AC

## 2019-06-09 NOTE — Discharge Instructions (Signed)
Cesarean Delivery, Care After This sheet gives you information about how to care for yourself after your procedure. Your health care provider may also give you more specific instructions. If you have problems or questions, contact your health care provider. What can I expect after the procedure? After the procedure, it is common to have:  A small amount of blood or clear fluid coming from the incision.  Some redness, swelling, and pain in your incision area.  Some abdominal pain and soreness.  Vaginal bleeding (lochia). Even though you did not have a vaginal delivery, you will still have vaginal bleeding and discharge.  Pelvic cramps.  Fatigue. You may have pain, swelling, and discomfort in the tissue between your vagina and your anus (perineum) if:  Your C-section was unplanned, and you were allowed to labor and push.  An incision was made in the area (episiotomy) or the tissue tore during attempted vaginal delivery. Follow these instructions at home: Incision care   Follow instructions from your health care provider about how to take care of your incision. Make sure you: ? Wash your hands with soap and water before you change your bandage (dressing). If soap and water are not available, use hand sanitizer. ? If you have a dressing, change it or remove it as told by your health care provider. ? Leave stitches (sutures), skin staples, skin glue, or adhesive strips in place. These skin closures may need to stay in place for 2 weeks or longer. If adhesive strip edges start to loosen and curl up, you may trim the loose edges. Do not remove adhesive strips completely unless your health care provider tells you to do that.  Check your incision area every day for signs of infection. Check for: ? More redness, swelling, or pain. ? More fluid or blood. ? Warmth. ? Pus or a bad smell.  Do not take baths, swim, or use a hot tub until your health care provider says it's okay. Ask your health  care provider if you can take showers.  When you cough or sneeze, hug a pillow. This helps with pain and decreases the chance of your incision opening up (dehiscing). Do this until your incision heals. Medicines  Take over-the-counter and prescription medicines only as told by your health care provider.  If you were prescribed an antibiotic medicine, take it as told by your health care provider. Do not stop taking the antibiotic even if you start to feel better.  Do not drive or use heavy machinery while taking prescription pain medicine. Lifestyle  Do not drink alcohol. This is especially important if you are breastfeeding or taking pain medicine.  Do not use any products that contain nicotine or tobacco, such as cigarettes, e-cigarettes, and chewing tobacco. If you need help quitting, ask your health care provider. Eating and drinking  Drink at least 8 eight-ounce glasses of water every day unless told not to by your health care provider. If you breastfeed, you may need to drink even more water.  Eat high-fiber foods every day. These foods may help prevent or relieve constipation. High-fiber foods include: ? Whole grain cereals and breads. ? Brown rice. ? Beans. ? Fresh fruits and vegetables. Activity   If possible, have someone help you care for your baby and help with household activities for at least a few days after you leave the hospital.  Return to your normal activities as told by your health care provider. Ask your health care provider what activities are safe for   you.  Rest as much as possible. Try to rest or take a nap while your baby is sleeping.  Do not lift anything that is heavier than 10 lbs (4.5 kg), or the limit that you were told, until your health care provider says that it is safe.  Talk with your health care provider about when you can engage in sexual activity. This may depend on your: ? Risk of infection. ? How fast you heal. ? Comfort and desire to  engage in sexual activity. General instructions  Do not use tampons or douches until your health care provider approves.  Wear loose, comfortable clothing and a supportive and well-fitting bra.  Keep your perineum clean and dry. Wipe from front to back when you use the toilet.  If you pass a blood clot, save it and call your health care provider to discuss. Do not flush blood clots down the toilet before you get instructions from your health care provider.  Keep all follow-up visits for you and your baby as told by your health care provider. This is important. Contact a health care provider if:  You have: ? A fever. ? Bad-smelling vaginal discharge. ? Pus or a bad smell coming from your incision. ? Difficulty or pain when urinating. ? A sudden increase or decrease in the frequency of your bowel movements. ? More redness, swelling, or pain around your incision. ? More fluid or blood coming from your incision. ? A rash. ? Nausea. ? Little or no interest in activities you used to enjoy. ? Questions about caring for yourself or your baby.  Your incision feels warm to the touch.  Your breasts turn red or become painful or hard.  You feel unusually sad or worried.  You vomit.  You pass a blood clot from your vagina.  You urinate more than usual.  You are dizzy or light-headed. Get help right away if:  You have: ? Pain that does not go away or get better with medicine. ? Chest pain. ? Difficulty breathing. ? Blurred vision or spots in your vision. ? Thoughts about hurting yourself or your baby. ? New pain in your abdomen or in one of your legs. ? A severe headache.  You faint.  You bleed from your vagina so much that you fill more than one sanitary pad in one hour. Bleeding should not be heavier than your heaviest period. Summary  After the procedure, it is common to have pain at your incision site, abdominal cramping, and slight bleeding from your vagina.  Check  your incision area every day for signs of infection.  Tell your health care provider about any unusual symptoms.  Keep all follow-up visits for you and your baby as told by your health care provider. This information is not intended to replace advice given to you by your health care provider. Make sure you discuss any questions you have with your health care provider. Document Revised: 07/19/2017 Document Reviewed: 07/19/2017 Elsevier Patient Education  2020 Elsevier Inc.  

## 2019-06-10 LAB — SURGICAL PATHOLOGY

## 2019-06-11 ENCOUNTER — Ambulatory Visit: Payer: Medicaid Other

## 2019-06-12 ENCOUNTER — Telehealth: Payer: Medicaid Other | Admitting: Obstetrics and Gynecology

## 2019-06-13 ENCOUNTER — Ambulatory Visit (INDEPENDENT_AMBULATORY_CARE_PROVIDER_SITE_OTHER): Payer: Medicaid Other | Admitting: Licensed Clinical Social Worker

## 2019-06-13 ENCOUNTER — Other Ambulatory Visit: Payer: Self-pay

## 2019-06-13 ENCOUNTER — Encounter: Payer: Self-pay | Admitting: Obstetrics and Gynecology

## 2019-06-13 ENCOUNTER — Ambulatory Visit (INDEPENDENT_AMBULATORY_CARE_PROVIDER_SITE_OTHER): Payer: Medicaid Other | Admitting: Obstetrics and Gynecology

## 2019-06-13 VITALS — BP 122/83 | HR 79 | Temp 98.3°F | Wt 151.6 lb

## 2019-06-13 DIAGNOSIS — F4322 Adjustment disorder with anxiety: Secondary | ICD-10-CM | POA: Diagnosis not present

## 2019-06-13 DIAGNOSIS — Z4889 Encounter for other specified surgical aftercare: Secondary | ICD-10-CM

## 2019-06-13 NOTE — Progress Notes (Signed)
Subjective:     Emily Welch is a 19 y.o. female who presents to [facility] 1 weeks status post cesarean delivery for surgical delivery of baby. Eating a regular diet without difficulty. Bowel movements are normal. Pain is controlled with current analgesics. Medications being used: ibuprofen (OTC).  The following portions of the patient's history were reviewed and updated as appropriate: allergies, current medications, past family history, past medical history, past social history, past surgical history and problem list.  Review of Systems Constitutional: negative Eyes: negative Ears, nose, mouth, throat, and face: negative Respiratory: negative Cardiovascular: negative Gastrointestinal: negative Genitourinary:negative Integument/breast: positive for C/S Honeycomb dressing Hematologic/lymphatic: negative Musculoskeletal:negative Neurological: negative Behavioral/Psych: negative Endocrine: negative Allergic/Immunologic: negative    Objective:    BP 122/83 (BP Location: Right Arm, Patient Position: Sitting, Cuff Size: Normal)   Pulse 79   Temp 98.3 F (36.8 C) (Oral)   Wt 151 lb 9.6 oz (68.8 kg)   LMP 10/20/2018   Breastfeeding Yes   BMI 29.61 kg/m  General:  alert, cooperative and no distress  Abdomen: soft, bowel sounds active, non-tender  Incision:   healing well, no drainage, no erythema, no hernia, no seroma, no swelling, no dehiscence, incision well approximated     Patient instructed on how to remove Honeycomb dressing so provider could evaluate the incision. Assessment:    Doing well postoperatively.   Plan:    1. Continue any current medications. 2. Wound care discussed. 3. Activity restrictions: no lifting more than 25 pounds 4. Anticipated return to work: after postpartum visit. 5. Follow up: 4 weeks for postpartum visit.   Raelyn Mora, CNM 06/13/2019

## 2019-06-13 NOTE — BH Specialist Note (Signed)
Integrated Behavioral Health Follow Up Visit  MRN: [id number] Name: Emily Welch  Number of Integrated Behavioral Health Clinician visits: 1 Session Start time: 10:19am  Session End time: 10:50 Total time: 31 minutes face to face at [facility]   Type of Service: Integrated Behavioral Health- Individual Interpretor:no  Interpretor Name and Language: none  SUBJECTIVE: Emily Welch is a 19 y.o. female accompanied by n/a Patient was referred by  For postpartum mood check  Patient reports the following symptoms/concerns: hx of suicidal ideation and depression  Duration of problem: intermittently for two years ; Severity of problem: mild  OBJECTIVE: Mood: good  and Affect: normal  Risk of harm to self or others: Emily Welch denies active thoughts of harming herself or others.   LIFE CONTEXT: Family and Social: Lives with FOB in [place], Emily Welch report she is relocating to [facility] June 25, 2019 School/Work: [employer] Self-Care: n/a Life Changes: Newborn currently in NICU   GOALS ADDRESSED: Patient will: 1.  Reduce symptoms of: worry, and anxiety 2.  Increase knowledge of diagnosis and demonstrate effective coping skills to alleviate symptoms  3.  Demonstrate ability to: self manage symptoms   INTERVENTIONS: Interventions utilized:  Supportive counseling  Standardized Assessments completed: phq9 and gad7   Integrated Behavioral Health from 06/13/2019 in [facility]  PHQ-9 Total Score  7     GAD 7 : Generalized Anxiety Score 06/13/2019 05/09/2019 02/01/2019  Nervous, Anxious, on Edge 1 3 3   Control/stop worrying 2 3 3   Worry too much - different things 1 3 3   Trouble relaxing 0 3 3  Restless 0 2 2  Easily annoyed or irritable 1 3 3   Afraid - awful might happen 1 2 2   Total GAD 7 Score 6 19 19   Anxiety Difficulty - Not difficult at all Not difficult at all     ASSESSMENT: Patient currently experiencing adjustment disorder w  depressed mood   Patient may benefit from outpatient behavioral health  PLAN: 1. Follow up with behavioral health clinician on : as needed  2. Behavioral recommendations: continue taking prescribed medication and stress reducing activities.  3. Referral(s): [facility] due to patient relocating to [facility]  4. "From scale of 1-10, how likely are you to follow plan?":   , LCSW

## 2019-06-17 ENCOUNTER — Ambulatory Visit: Payer: Medicaid Other

## 2019-06-26 DIAGNOSIS — H52223 Regular astigmatism, bilateral: Secondary | ICD-10-CM | POA: Diagnosis not present

## 2019-06-26 DIAGNOSIS — H5203 Hypermetropia, bilateral: Secondary | ICD-10-CM | POA: Diagnosis not present

## 2019-06-26 DIAGNOSIS — H5213 Myopia, bilateral: Secondary | ICD-10-CM | POA: Diagnosis not present

## 2019-06-26 DIAGNOSIS — H43393 Other vitreous opacities, bilateral: Secondary | ICD-10-CM | POA: Diagnosis not present

## 2019-06-27 ENCOUNTER — Ambulatory Visit: Payer: Medicaid Other | Admitting: Licensed Clinical Social Worker

## 2019-06-27 ENCOUNTER — Ambulatory Visit: Payer: Medicaid Other | Admitting: Obstetrics and Gynecology

## 2019-06-30 ENCOUNTER — Ambulatory Visit: Payer: Self-pay

## 2019-06-30 NOTE — Lactation Note (Signed)
This note was copied from a baby's chart. Lactation Consultation Note Baby 34 5/7 wks (3 wks old) Baby rooting.  Mom holding baby STS trying to latch in cradle position, not having a good hold of baby. LC assisted in feeding. Baby latched well off and on several times, mom pushing baby to much into her breast.  Body alignment, positions, support, discussed. Tried football hold. Baby fed well hearing swallows. Baby got the hiccups then stopped. Became tired after 5 minutes off and on. Praised mom how good that was. Encouraged to just try short feedings d/t tiring the baby. If she notices the baby has increased breathing stop feeding, hold STS.  Mom stated that her milk supply was slowing down and she didn't want it to. LC asked mom if she is pumping every 3 hrs. Mom said that a Nurse told her to stop because she had to much milk and they would tell her when they wanted more milk and she could start pumping again. That has been over 2 weeks! LC can't imagine someone telling mom to stop pumping. LC feels mom misunderstood what the nurse meant. NICU had a lot of frozen milk and they didn't need mom to bring anymore and they would let her know when they needed more milk. LC feels that is what the nurse meant, not to stop pumping.  LC wrote a plan for mom to increase her milk supply: Pump every 3 hrs. Set her clock. Once a day until her milk supply comes in well where she wants it, Power pump once a day. Explained to mom what power pumping was. Suggested to get Fenugreek as a supplement, eat Oatmeal and hydrate.  Encouraged breast massage during pumping.  Explained how to make a hands free bra.  Praised mom for her hard work at wanting to provide [facility] for her baby.   Patient Name: Boy Lia Vigilante OMVEH'M Date: 06/30/2019 Reason for consult: Follow-up assessment;Mother's request;Primapara;NICU baby;Preterm <34wks;Infant < 6lbs   Maternal Data Has patient been taught Hand Expression?:  Yes Does the patient have breastfeeding experience prior to this delivery?: No  Feeding Feeding Type: Breast Fed  LATCH Score Latch: Repeated attempts needed to sustain latch, nipple held in mouth throughout feeding, stimulation needed to elicit sucking reflex.  Audible Swallowing: A few with stimulation  Type of Nipple: Everted at rest and after stimulation  Comfort (Breast/Nipple): Soft / non-tender  Hold (Positioning): Assistance needed to correctly position infant at breast and maintain latch.  LATCH Score: 7  Interventions Interventions: Breast feeding basics reviewed;Support pillows;Assisted with latch;Position options;Skin to skin;Breast massage;Hand express;Breast compression;Adjust position;DEBP  Lactation Tools Discussed/Used Tools: Pump Breast pump type: Double-Electric Breast Pump   Consult Status Consult Status: PRN Follow-up type: In-patient    Charyl Dancer 06/30/2019, 9:38 PM

## 2019-07-08 ENCOUNTER — Ambulatory Visit: Payer: Self-pay

## 2019-07-08 NOTE — Lactation Note (Signed)
This note was copied from a baby's chart. Lactation Consultation Note  Patient Name: Emily Welch [id number] Date: 07/08/2019 Reason for consult: Follow-up assessment   Staff nurse ask if I would speak with patient about latching infant. Mother had question about burping infant after bottle feeding infant.   She also ask if ok to breastfeed and bottle feed. She report to me that Speech told her that she could do either on she wanted to.  Mother advised to continue to lick and learn and page [facility] staff for a [facility] consult with in the week for a scheduled visit.  Advised mother to continue to keep pumping every 2-3 hours.  Discussed this with her nurse and suggested that someone call and help mother schedule an appt for follow up when ready.    Maternal Data    Feeding Feeding Type: Breast Milk  LATCH Score                   Interventions    Lactation Tools Discussed/Used     Consult Status Consult Status: PRN    Michel Bickers 07/08/2019, 2:45 PM

## 2019-07-11 ENCOUNTER — Encounter: Payer: Medicaid Other | Admitting: Licensed Clinical Social Worker

## 2019-07-11 ENCOUNTER — Telehealth (INDEPENDENT_AMBULATORY_CARE_PROVIDER_SITE_OTHER): Payer: Medicaid Other | Admitting: Obstetrics and Gynecology

## 2019-07-11 ENCOUNTER — Encounter: Payer: Self-pay | Admitting: General Practice

## 2019-07-11 DIAGNOSIS — O99345 Other mental disorders complicating the puerperium: Secondary | ICD-10-CM | POA: Diagnosis not present

## 2019-07-11 DIAGNOSIS — Z8659 Personal history of other mental and behavioral disorders: Secondary | ICD-10-CM | POA: Diagnosis not present

## 2019-07-11 DIAGNOSIS — F53 Postpartum depression: Secondary | ICD-10-CM

## 2019-07-11 DIAGNOSIS — F411 Generalized anxiety disorder: Secondary | ICD-10-CM | POA: Diagnosis not present

## 2019-07-11 NOTE — Progress Notes (Signed)
I connected with@ on 07/14/19 at 11:10 AM EDT by: Mychart video and verified that I am speaking with the correct person using two identifiers.  Patient is located at home and provider is located at [employer] for [employer] at [place] .     The purpose of this virtual visit is to provide medical care while limiting exposure to the novel coronavirus. I discussed the limitations, risks, security and privacy concerns of performing an evaluation and management service by My Chart and the availability of in person appointments. I also discussed with the patient that there may be a patient responsible charge related to this service. By engaging in this virtual visit, you consent to the provision of healthcare.  Additionally, you authorize for your insurance to be billed for the services provided during this visit.  The patient expressed understanding and agreed to proceed.  The following staff members participated in the virtual visit:  Latina Craver, RN & Laury Deep, [place] Partum Visit Note Subjective:   Emily Welch is a 19 y.o. G98P0101 female being evaluated for postpartum followup.  She is 5 weeks postpartum following a primary cesarean section at  31.2 gestational weeks.  I have fully reviewed the prenatal and intrapartum course; pregnancy complicated by FGR, AEDF and Severe PEC.  Postpartum course has been some depression over baby being born early and in NICU. Baby is doing well in the NICU and gaining weigh. Baby is feeding by bottle - in NICU. Bleeding moderate lochia. Bowel function is normal. Bladder function is normal. Patient is not sexually active. Contraception method is Nexplanon. Postpartum depression screening: positive; score = 8.  The following portions of the patient's history were reviewed and updated as appropriate: allergies, current medications, past family history, past medical history, past social history, past surgical history and problem list.  Review  of Systems Constitutional: negative Eyes: negative Ears, nose, mouth, throat, and face: negative Respiratory: negative Cardiovascular: negative Gastrointestinal: negative Genitourinary:negative Integument/breast: negative, incision is healed Hematologic/lymphatic: negative Musculoskeletal:negative Neurological: negative Behavioral/Psych: positive for depression Endocrine: negative Allergic/Immunologic: negative   Objective:  There were no vitals filed for this visit. Self-Obtained       Assessment:  Encounter for postpartum visit - Normal postpartum exam.  Postpartum care following cesarean delivery - Incision appears to be well-healed (virtual visit)  Postpartum depression - Offered to meet virtually with Lynnea Ferrier, LCSW after PP visit -- declined - Has therapy appointment today at 2 pm - Motivated to stay positive and apply coping mechanisms she has learned through therapy  History of behavioral and mental health problems - Patient reports that seeing a psychiatrist and going to therapy sessions; which are helping     Plan:  Essential components of care per ACOG recommendations:  1.  Mood and well being: Patient with positive depression screening today. Reviewed local resources for support.Patient has psychiatrist - Patient does not use tobacco. - hx of drug use? No    2. Infant care and feeding:  -Patient currently breastmilk feeding? No If breastmilk feeding discussed return to work and pumping. If needed, patient was provided letter for work to allow for every 2-3 hr pumping breaks, and to be granted a private location to express breastmilk and refrigerated area to store breastmilk. Reviewed importance of draining breast regularly to support lactation. -Social determinants of health (SDOH) reviewed in EPIC. No concerns  3. Sexuality, contraception and birth spacing - Patient does not want a pregnancy in the next year.  Desired family size is unsure   children.  - Reviewed forms of contraception in tiered fashion. Patient desired Nexplanon today.   - Discussed birth spacing of 18 months  4. Sleep and fatigue -Encouraged family/partner/community support of 4 hrs of uninterrupted sleep to help with mood and fatigue  5. Physical Recovery  - Discussed patients delivery and complications - Patient has urinary incontinence? No - Patient is safe to resume physical and sexual activity  6.  Health Maintenance - Last pap smear done n/a and was n/a with negative HPV. Mammogram - n/a  7. Chronic Disease - PCP follow up  Follow-up 1 year for AEX with Pap or prn  10 minutes of non-face-to-face time spent with the patient. There was 5 minutes of chart review time spent prior to this encounter. Total time spent = 15 minutes.    Raelyn Mora, CNM Center for [employer], [facility]

## 2019-07-12 DIAGNOSIS — H5203 Hypermetropia, bilateral: Secondary | ICD-10-CM | POA: Diagnosis not present

## 2019-07-12 DIAGNOSIS — H52223 Regular astigmatism, bilateral: Secondary | ICD-10-CM | POA: Diagnosis not present

## 2019-07-14 ENCOUNTER — Encounter: Payer: Self-pay | Admitting: Obstetrics and Gynecology

## 2019-07-14 DIAGNOSIS — F53 Postpartum depression: Secondary | ICD-10-CM | POA: Insufficient documentation

## 2019-07-14 DIAGNOSIS — Z8659 Personal history of other mental and behavioral disorders: Secondary | ICD-10-CM | POA: Insufficient documentation

## 2019-07-16 ENCOUNTER — Ambulatory Visit: Payer: Self-pay

## 2019-07-16 NOTE — Lactation Note (Signed)
This note was copied from a baby's chart. Lactation Consultation Note  Patient Name: Emily Welch [id number] Date: 07/16/2019 Reason for consult: Follow-up assessment;NICU baby  I followed up with Ms. Boies, whose 39 week old son, Salley Slaughter, may be discharged today. She was feeding him formula in side-lying upon entry. She reports that she is pumping, and she states that she knows to pump every three hours. She has a [employer] DEBP at home and all needed supplies.  Ms. Lovick states that her milk supply dropped significantly due to a "mix-up." It was a bit difficult to fully understand, but she was under the impression that she should stop pumping temporarily because the NICU had plenty of her milk. When she resumed pumping, she was pumping significantly less. It's unclear as to the duration that she went without pumping.  Now she states that her daily pumping volume varies. She states that she had her first covid vaccination two days ago, and she's been feeling a little tired. She has some days where she sees more milk express than others, and she attributes this to stress. Her goal is to try to take good care of her mental and physical health, and hopefully this will help her milk production.  She will be following up with [facility]. I mentioned that there was an IBCLC on staff there. She asked that I put in a referral for an OP follow up. I also provided her with our community resources brochure and reviewed it.  We reviewed milk storage guidelines as well. All questions answered at this time.   Feeding Feeding Type: Breast Milk with Formula added Nipple Type: Dr. Levert Feinstein Preemie   Interventions Interventions: Breast feeding basics reviewed (OP referral)  Lactation Tools Discussed/Used WIC Program: Yes Pump Review: Setup, frequency, and cleaning;Milk Storage   Consult Status Consult Status: Complete Date: 07/16/19 Follow-up type: Out-patient    Walker Shadow 07/16/2019, 8:16 AM

## 2019-07-24 ENCOUNTER — Encounter: Payer: Medicaid Other | Admitting: Licensed Clinical Social Worker

## 2019-08-20 ENCOUNTER — Ambulatory Visit (HOSPITAL_COMMUNITY): Payer: Medicaid Other | Admitting: Psychiatry

## 2019-08-20 ENCOUNTER — Other Ambulatory Visit: Payer: Self-pay

## 2019-08-21 DIAGNOSIS — F411 Generalized anxiety disorder: Secondary | ICD-10-CM | POA: Diagnosis not present

## 2019-08-22 DIAGNOSIS — F411 Generalized anxiety disorder: Secondary | ICD-10-CM | POA: Diagnosis not present

## 2019-08-27 DIAGNOSIS — F411 Generalized anxiety disorder: Secondary | ICD-10-CM | POA: Diagnosis not present

## 2019-09-17 ENCOUNTER — Ambulatory Visit (HOSPITAL_COMMUNITY)
Admission: EM | Admit: 2019-09-17 | Discharge: 2019-09-17 | Disposition: A | Discharge status: Home / Self Care | Payer: Medicaid Other | Attending: Registered Nurse | Admitting: Registered Nurse

## 2019-09-17 ENCOUNTER — Encounter (HOSPITAL_COMMUNITY): Payer: Self-pay | Admitting: Registered Nurse

## 2019-09-17 ENCOUNTER — Other Ambulatory Visit: Payer: Self-pay

## 2019-09-17 DIAGNOSIS — F329 Major depressive disorder, single episode, unspecified: Secondary | ICD-10-CM | POA: Insufficient documentation

## 2019-09-17 DIAGNOSIS — F53 Postpartum depression: Secondary | ICD-10-CM

## 2019-09-17 DIAGNOSIS — Z8659 Personal history of other mental and behavioral disorders: Secondary | ICD-10-CM

## 2019-09-17 MED ORDER — ARIPIPRAZOLE 5 MG PO TABS
5.0000 mg | ORAL_TABLET | Freq: Once | ORAL | 0 refills | Status: DC
Start: 2019-09-17 — End: 2019-10-17

## 2019-09-17 MED ORDER — ARIPIPRAZOLE 5 MG PO TABS
5.0000 mg | ORAL_TABLET | Freq: Once | ORAL | 0 refills | Status: DC
Start: 2019-09-17 — End: 2019-09-17

## 2019-09-17 MED ORDER — HYDROXYZINE HCL 25 MG PO TABS: 25.0000 mg | ORAL_TABLET | Freq: Three times a day (TID) | ORAL | Status: DC | PRN

## 2019-09-17 MED ORDER — HYDROXYZINE HCL 25 MG PO TABS: 25.0000 mg | ORAL_TABLET | Freq: Three times a day (TID) | ORAL | 0 refills | Status: DC | PRN

## 2019-09-17 MED ORDER — HYDROXYZINE HCL 25 MG PO TABS
25.0000 mg | ORAL_TABLET | Freq: Three times a day (TID) | ORAL | 0 refills | Status: DC | PRN
Start: 2019-09-17 — End: 2019-09-17

## 2019-09-17 MED ORDER — ARIPIPRAZOLE 5 MG PO TABS: 5.0000 mg | ORAL_TABLET | Freq: Once | ORAL | Status: DC

## 2019-09-17 NOTE — Discharge Instructions (Signed)
Please follow up with [facility] to request to see a psychiatrist for medication management within the next two weeks.    [facility] Address: [address] [place], [place] [address] Phone: [phone]   To inquire about the [facility] programs, contact them at [phone].

## 2019-09-17 NOTE — Progress Notes (Signed)
Desi received her AVS, her questions were answered, she received her prescriptions and literature. She retrived her personal belongings and was escorted to the lobby without incident.

## 2019-09-17 NOTE — ED Provider Notes (Addendum)
Behavioral Health Medical Screening Exam  Emily Welch is a 19 y.o. female patient presents to [facility] with complaints of post partum depression and a history of major depression prior to pregnancy.  Patient states that she has taking Prozac and Abilify together prior to pregnancy and it had worked for her.  She is currently only taking Prozac 40 mg.  States that she feels fatigued, with low energy, unable to do the things she use to do and feels that the depression is getting worse.  States she has good support with the father of the baby and his parents.  Patient is current receiving outpatient therapy at [facility] for cannabis use disorder "I'm trying to stop smoking pot."  States she smokes daily.  Discussed adding Abilify back to medication regimen and following up at [facility] for medication management also.  Patient in agreement.   Patient states that she is not breastfeeding  Total Time spent with patient: 30 minutes  Psychiatric Specialty Exam  Presentation  General Appearance:Appropriate for Environment;Casual  Eye Contact:Good  Speech:Clear and Coherent;Normal Rate  Speech Volume:Normal  Handedness:Right   Mood and Affect  Mood:Anxious;Depressed  Affect:Depressed   Thought Process  Thought Processes:Coherent;Goal Directed  Descriptions of Associations:Intact  Orientation:Full (Time, Place and Person)  Thought Content:WDL  Hallucinations:None  Ideas of Reference:None  Suicidal Thoughts:Yes, Passive (States she has had thoughts but no intent or plan.  Thoughts of just not waking up or that nobody cares) Without Intent;Without Plan  Homicidal Thoughts:No   Sensorium  Memory:Immediate Good;Recent Good;Remote Good  Judgment:No data recorded Insight:No data recorded  Executive Functions  Concentration:Good  Attention Span:Good  Recall:Good  Fund of Knowledge:Good  Language:Good   Psychomotor Activity  Psychomotor  Activity:Normal   Assets  Assets:Communication Skills;Desire for Improvement;Housing;Physical Health;Social Support;Transportation   Sleep  Sleep:Fair  Number of hours: No data recorded  Physical Exam: Physical Exam Vitals reviewed.  Constitutional:      Appearance: Normal appearance.  HENT:     Head: Normocephalic.  Pulmonary:     Effort: Pulmonary effort is normal.  Musculoskeletal:        General: Normal range of motion.  Skin:    General: Skin is warm and dry.  Neurological:     Mental Status: She is alert.  Psychiatric:        Attention and Perception: Attention and perception normal.        Mood and Affect: Mood is anxious and depressed.        Speech: Speech normal.        Behavior: Behavior normal. Behavior is cooperative.        Thought Content: Thought content normal. Thought content is not paranoid or delusional. Thought content does not include homicidal or suicidal ideation.        Cognition and Memory: Cognition and memory normal.        Judgment: Judgment normal.    Review of Systems  Psychiatric/Behavioral: Depression: Stable. Hallucinations: Deneis. Memory loss: Denies. Substance abuse: THC daily; currently receiving therpy to quit. Suicidal ideas: Has had thoughts that no one cares or of not waking up but states she would never kill herself; has has no history of prior suicide attempt. Nervous/anxious: Stable. Insomnia: Hyperinsomnia.        History of major depression and treated in past; restarted on Prozac during pregnancy; but took along with Abilify in the past and work.   All other systems reviewed and are negative.  Blood pressure 111/71, pulse  69, temperature 98.8 F (37.1 C), temperature source Oral, resp. rate 16, height 5' (1.524 m), weight 147 lb (66.7 kg), SpO2 99 %, currently breastfeeding. Body mass index is 28.71 kg/m.  Musculoskeletal: Strength & Muscle Tone: within normal limits Gait & Station: normal Patient leans: N/A Meds  ordered this encounter  Medications  . ARIPiprazole (ABILIFY) tablet 5 mg  . hydrOXYzine (ATARAX/VISTARIL) tablet 25 mg  . ARIPiprazole (ABILIFY) 5 MG tablet    Sig: Take 1 tablet (5 mg total) by mouth once for 1 dose.    Dispense:  30 tablet    Refill:  0    Order Specific Question:   Supervising Provider    Answer:   Nelly Rout [[id number]]  . hydrOXYzine (ATARAX/VISTARIL) 25 MG tablet    Sig: Take 1 tablet (25 mg total) by mouth 3 (three) times daily as needed for anxiety.    Dispense:  45 tablet    Refill:  0    Order Specific Question:   Supervising Provider    Answer:   Nelly Rout [[id number]]     Recommendations:  Continue Prozac 40 mg daily; add Abilify 5 mg daily for depression and mood stabilization.  Follow up at [facility] for medication management within the next 2 weeks and continue outpatient therapy.      Discharge Instructions     Please follow up with [facility] to request to see a psychiatrist for medication management within the next two weeks.    [facility] Address: [address] [place], [place] [address] Phone: [phone]   To inquire about the [facility] programs, contact them at [phone].       Based on my evaluation the patient does not appear to have an emergency medical condition.  Lavoris Canizales, NP 09/17/2019, 5:43 PM

## 2019-09-17 NOTE — BH Assessment (Signed)
Comprehensive Clinical Assessment (CCA) Screening, Triage and Referral Note  09/17/2019 Emily Welch [id number]   Patient is a 19 y.o. female with a history of depression who presents voluntarily to [facility] for assessment.  She reports worsening depression since she had her son six months ago.  She is concerned she may be experiencing postpartum depression. She states she experienced mild depression before and during pregnancy. She was prescribed prozac, which was helpful during pregnancy, however no longer effective after she had the baby.  Seh reports depressive symptoms of low energy, difficulty getting out of the bed, low motivation, hopelessness and vague SI. She states she continues to have suicidal thoughts, however she has no plan , intent or history of attempts.  She sees a therapist with [facility] for SA counseling.  She is open to re-starting prozac, along with abilify, as this regimen was effective in the past.  LPC learned that [facility] does offer medication management.  Patient informed and states she will call to request an appointment with their provider within the next two weeks.  Patient has also been provided with referral information for [facility], as they are now accepting referrals for their IOP programs.   Per Emily Found, NP patient is psychiatrically cleared for discharge.  Patient plans to follow up with [facility] to request an appt with one of their med management providers.  She was also provided with information for the [facility] IOP programs.   Visit Diagnosis:    ICD-10-CM   1. Post-partum depression  O99.345    F53.0   2. History of major depression  Z86.59     Patient Reported Information How did you hear about [place]? Self   Referral name: No data recorded  Referral phone number: No data recorded Whom do you see for routine medical problems? I don't have a doctor   Practice/Facility Name:  No data recorded  Practice/Facility Phone Number: No data recorded  Name of Contact: No data recorded  Contact Number: No data recorded  Contact Fax Number: No data recorded  Prescriber Name: No data recorded  Prescriber Address (if known): No data recorded What Is the Reason for Your Visit/Call Today? Patient presents reporting worsening depression and suspects she is dealing with postpartum depression.  How Long Has This Been Causing You Problems? 1-6 months  Have You Recently Been in Any Inpatient Treatment (Hospital/Detox/Crisis Center/28-Day Program)? No   Name/Location of Program/Hospital:No data recorded  How Long Were You There? No data recorded  When Were You Discharged? No data recorded Have You Ever Received Services From [facility] Before? No   Who Do You See at [facility]? No data recorded Have You Recently Had Any Thoughts About Hurting Yourself? Yes   Are You Planning to Commit Suicide/Harm Yourself At This time?  No  Have you Recently Had Thoughts About Hurting Someone Emily Welch? No   Explanation: No data recorded Have You Used Any Alcohol or Drugs in the Past 24 Hours? Yes   How Long Ago Did You Use Drugs or Alcohol?  No data recorded  What Did You Use and How Much? THC - amount varies  What Do You Feel Would Help You the Most Today? Medication;Assessment Only  Do You Currently Have a Therapist/Psychiatrist? Yes   Name of Therapist/Psychiatrist: [facility] - therapy   Have You Been Recently Discharged From Any Office Practice or Programs? No   Explanation of Discharge From Practice/Program:  No data recorded  CCA Screening Triage Referral Assessment Type of Contact: Face-to-Face   Is this Initial or Reassessment? No data recorded  Date Telepsych consult ordered in [facility]:  No data recorded  Time Telepsych consult ordered in [facility]:  No data recorded Patient Reported Information Reviewed? Yes   Patient Left Without Being Seen? No data  recorded  Reason for Not Completing Assessment: No data recorded Collateral Involvement: N/A  Does Patient Have a Court Appointed Legal Guardian? No data recorded  Name and Contact of Legal Guardian:  No data recorded If Minor and Not Living with Parent(s), Who has Custody? No data recorded Is CPS involved or ever been involved? Never  Is APS involved or ever been involved? Never  Patient Determined To Be At Risk for Harm To Self or Others Based on Review of Patient Reported Information or Presenting Complaint? No   Method: No data recorded  Availability of Means: No data recorded  Intent: No data recorded  Notification Required: No data recorded  Additional Information for Danger to Others Potential:  No data recorded  Additional Comments for Danger to Others Potential:  No data recorded  Are There Guns or Other Weapons in Your Home?  No data recorded   Types of Guns/Weapons: No data recorded   Are These Weapons Safely Secured?                              No data recorded   Who Could Verify You Are Able To Have These Secured:    No data recorded Do You Have any Outstanding Charges, Pending Court Dates, Parole/Probation? No data recorded Contacted To Inform of Risk of Harm To Self or Others: No data recorded Location of Assessment: [facility] Services  Does Patient Present under Involuntary Commitment? No   IVC Papers Initial File Date: No data recorded  [place] of Residence: [place]  Patient Currently Receiving the Following Services: Individual Therapy   Determination of Need: Routine (7 days)   Options For Referral: Medication Management;Intensive Outpatient Therapy;Outpatient Therapy   Emily Welch

## 2019-10-02 DIAGNOSIS — F411 Generalized anxiety disorder: Secondary | ICD-10-CM | POA: Diagnosis not present

## 2019-10-16 ENCOUNTER — Telehealth (HOSPITAL_COMMUNITY): Payer: Self-pay | Admitting: *Deleted

## 2019-10-16 ENCOUNTER — Telehealth: Payer: Self-pay | Admitting: *Deleted

## 2019-10-16 NOTE — Telephone Encounter (Signed)
Patient called and stated that she was seen by you at the [facility] and Vistaril and Abilify were added to her Prozac.  She is going to run out of these meds before her appointment at [facility] on Sept 30. Please review and order meds.

## 2019-10-16 NOTE — Telephone Encounter (Signed)
Patient called requesting refills on mental health medication. Advised patient that the medication was prescribed during pregnancy and postpartum. However, she has not seen [facility] provider since June for her 6 week postpartum follow up. Advised patient that at this time she should be seen by a Behavioral Health Specialist or go back to [facility] ED for evaluation. As the nurse was speaking to patient, the phone call disconnected. Nurse tried to call back up call went straight to voice mail.  Clovis Pu, RN

## 2019-10-17 ENCOUNTER — Other Ambulatory Visit (HOSPITAL_COMMUNITY): Payer: Self-pay | Admitting: *Deleted

## 2019-10-17 MED ORDER — ARIPIPRAZOLE 5 MG PO TABS
5.0000 mg | ORAL_TABLET | Freq: Every day | ORAL | 0 refills | Status: AC
Start: 2019-10-17 — End: ?

## 2019-10-17 MED ORDER — HYDROXYZINE HCL 25 MG PO TABS
25.0000 mg | ORAL_TABLET | Freq: Three times a day (TID) | ORAL | 0 refills | Status: AC | PRN
Start: 2019-10-17 — End: 2019-11-01

## 2019-10-17 NOTE — Telephone Encounter (Signed)
Open in error

## 2019-10-30 ENCOUNTER — Other Ambulatory Visit: Payer: Self-pay

## 2019-10-30 ENCOUNTER — Ambulatory Visit: Payer: Medicaid Other | Admitting: Nurse Practitioner

## 2019-12-09 ENCOUNTER — Other Ambulatory Visit: Payer: Self-pay

## 2019-12-09 ENCOUNTER — Ambulatory Visit (INDEPENDENT_AMBULATORY_CARE_PROVIDER_SITE_OTHER): Payer: Medicaid Other

## 2019-12-09 DIAGNOSIS — Z23 Encounter for immunization: Secondary | ICD-10-CM

## 2020-01-21 ENCOUNTER — Emergency Department (HOSPITAL_COMMUNITY)
Admission: EM | Admit: 2020-01-21 | Discharge: 2020-01-22 | Disposition: A | Discharge status: Home / Self Care | Payer: Medicaid Other | Attending: Emergency Medicine | Admitting: Emergency Medicine

## 2020-01-21 ENCOUNTER — Encounter (HOSPITAL_COMMUNITY): Payer: Self-pay | Admitting: Emergency Medicine

## 2020-01-21 ENCOUNTER — Other Ambulatory Visit: Payer: Self-pay

## 2020-01-21 DIAGNOSIS — Z87891 Personal history of nicotine dependence: Secondary | ICD-10-CM | POA: Insufficient documentation

## 2020-01-21 DIAGNOSIS — N939 Abnormal uterine and vaginal bleeding, unspecified: Secondary | ICD-10-CM

## 2020-01-21 DIAGNOSIS — R102 Pelvic and perineal pain: Secondary | ICD-10-CM | POA: Diagnosis present

## 2020-01-21 DIAGNOSIS — J45909 Unspecified asthma, uncomplicated: Secondary | ICD-10-CM | POA: Diagnosis not present

## 2020-01-21 LAB — URINALYSIS, ROUTINE W REFLEX MICROSCOPIC
Bacteria, UA: NONE SEEN
Bilirubin Urine: NEGATIVE
Glucose, UA: NEGATIVE mg/dL
Ketones, ur: NEGATIVE mg/dL
Leukocytes,Ua: NEGATIVE
Nitrite: NEGATIVE
Protein, ur: NEGATIVE mg/dL
Specific Gravity, Urine: 1.013 (ref 1.005–1.030)
pH: 6 (ref 5.0–8.0)

## 2020-01-21 LAB — CBC
HCT: 41.2 % (ref 36.0–46.0)
Hemoglobin: 14.1 g/dL (ref 12.0–15.0)
MCH: 30.3 pg (ref 26.0–34.0)
MCHC: 34.2 g/dL (ref 30.0–36.0)
MCV: 88.6 fL (ref 80.0–100.0)
Platelets: 278 10*3/uL (ref 150–400)
RBC: 4.65 MIL/uL (ref 3.87–5.11)
RDW: 12.7 % (ref 11.5–15.5)
WBC: 6.8 10*3/uL (ref 4.0–10.5)
nRBC: 0 % (ref 0.0–0.2)

## 2020-01-21 LAB — COMPREHENSIVE METABOLIC PANEL
ALT: 11 U/L (ref 0–44)
AST: 13 U/L — ABNORMAL LOW (ref 15–41)
Albumin: 3.6 g/dL (ref 3.5–5.0)
Alkaline Phosphatase: 55 U/L (ref 38–126)
Anion gap: 8 (ref 5–15)
BUN: 5 mg/dL — ABNORMAL LOW (ref 6–20)
CO2: 24 mmol/L (ref 22–32)
Calcium: 9 mg/dL (ref 8.9–10.3)
Chloride: 106 mmol/L (ref 98–111)
Creatinine, Ser: 0.93 mg/dL (ref 0.44–1.00)
GFR, Estimated: >60 mL/min (ref >60)
Glucose, Bld: 84 mg/dL (ref 70–99)
Potassium: 3.6 mmol/L (ref 3.5–5.1)
Sodium: 138 mmol/L (ref 135–145)
Total Bilirubin: 0.7 mg/dL (ref 0.3–1.2)
Total Protein: 6.5 g/dL (ref 6.5–8.1)

## 2020-01-21 LAB — LIPASE, BLOOD: Lipase: 22 U/L (ref 11–51)

## 2020-01-21 LAB — I-STAT BETA HCG BLOOD, ED (MC, WL, AP ONLY): I-stat hCG, quantitative: <5 m[IU]/mL (ref <5)

## 2020-01-21 NOTE — ED Triage Notes (Signed)
Pt c/o right lower abdominal pain and vaginal bleeding x 1 month. States she has an implant, but is bleeding more than she normally does.

## 2020-01-22 LAB — RAPID HIV SCREEN (HIV 1/2 AB+AG)
HIV 1/2 Antibodies: NONREACTIVE
HIV-1 P24 Antigen - HIV24: NONREACTIVE

## 2020-01-22 LAB — WET PREP, GENITAL
Sperm: NONE SEEN
Trich, Wet Prep: NONE SEEN
Yeast Wet Prep HPF POC: NONE SEEN

## 2020-01-22 NOTE — ED Provider Notes (Signed)
[facility] EMERGENCY DEPARTMENT Provider Note   CSN: [id number] Arrival date & time: 01/21/20  1955     History Chief Complaint  Patient presents with  . Abdominal Pain    Emily Welch is a 19 y.o. female with PMH significant for Nexplanon placement who presents the ED with multiple complaints.  On my examination, contrary to triage note the patient is adamantly denying any abdominal or pelvic pain.  She states that her vaginal bleeding has been intermittent.  Currently, she denies any vaginal bleeding.  She also states that she has a mildly numb right second toe which has been ongoing for months.  She is also requesting STI testing given new partner.  She has questionable vaginal discharge, but again denies any pain symptoms.  She also states that sometimes she feels a little bit lightheaded and is unsure if it is related to her blood pressures.  She states that she does not have an OB/GYN or a primary care provider.  Patient understands that the emergency room is not the most appropriate place given her subacute/chronic complaints, however did not have anywhere else to go.    She denies any fevers, dysuria, increased urinary frequency, chest pain or shortness of breath, abdominal pain, nausea or vomiting, pain with intercourse, or other symptoms.  HPI     Past Medical History:  Diagnosis Date  . Asthma   . Depression    gets emotional, meds helping  . Infection    UTI    Patient Active Problem List   Diagnosis Date Noted  . History of major depression 09/17/2019  . Encounter for postpartum visit 07/14/2019  . Post-partum depression 07/14/2019  . History of behavioral and mental health problems 07/14/2019  . Nexplanon insertion 06/08/2019  . Gestational hypertension 06/06/2019  . IUGR (intrauterine growth restriction) affecting care of mother 06/04/2019  . Major depressive disorder with current active episode 02/27/2019    Past Surgical History:   Procedure Laterality Date  . CESAREAN SECTION N/A 06/06/2019   Procedure: CESAREAN SECTION;  Surgeon: Adam Phenix, MD;  Location: [facility] LD ORS;  Service: Obstetrics;  Laterality: N/A;  . NO PAST SURGERIES       OB History    Gravida  1   Para  1   Term      Preterm  1   AB      Living  1     SAB      IAB      Ectopic      Multiple  0   Live Births  1           Family History  Problem Relation Age of Onset  . Heart disease Father   . Diabetes Sister     Social History   Tobacco Use  . Smoking status: Former Smoker    Types: Cigarettes  . Smokeless tobacco: Former Neurosurgeon  . Tobacco comment: quit with preg  Vaping Use  . Vaping Use: Never used  Substance Use Topics  . Alcohol use: Not Currently  . Drug use: Yes    Types: Marijuana    Comment: last used 05/26/19    Home Medications Prior to Admission medications   Medication Sig Start Date End Date Taking? Authorizing Provider  acetaminophen (TYLENOL) 500 MG tablet Take 1,000 mg by mouth every 6 (six) hours as needed for mild pain or headache.    [provider]  amLODipine (NORVASC) 10 MG tablet Take 1 tablet (  10 mg total) by mouth daily. 06/09/19   Anyanwu, Jethro Bastos, MD  ARIPiprazole (ABILIFY) 5 MG tablet Take 1 tablet (5 mg total) by mouth daily. 10/17/19   Money, Gerlene Burdock, FNP  Blood Pressure Monitoring (BLOOD PRESSURE MONITOR AUTOMAT) DEVI 1 Device by Does not apply route daily. Automatic Blood pressure cuff regular or large size. Monitor blood pressure regularly at home. ICD-10 Code: O09.90 01/08/19   Raelyn Mora, CNM  docusate sodium (COLACE) 100 MG capsule Take 1 capsule (100 mg total) by mouth 2 (two) times daily as needed for mild constipation or moderate constipation. 06/09/19   Anyanwu, Jethro Bastos, MD  FLUoxetine (PROZAC) 40 MG capsule Take 1 capsule (40 mg total) by mouth daily. 03/06/19 03/05/20  Gerrit Heck, CNM  ibuprofen (ADVIL) 800 MG tablet Take 1 tablet (800 mg total) by mouth 3  (three) times daily with meals as needed for headache, moderate pain or cramping. 06/09/19   Anyanwu, Jethro Bastos, MD  oxyCODONE (OXY IR/ROXICODONE) 5 MG immediate release tablet Take 1 tablet (5 mg total) by mouth every 4 (four) hours as needed for severe pain. 06/09/19   Anyanwu, Jethro Bastos, MD  Prenatal MV & Min w/FA-DHA (PRENATAL ADULT GUMMY/DHA/FA) 0.4-25 MG CHEW Chew 1 tablet by mouth daily. 12/03/18   Raelyn Mora, CNM    Allergies    Patient has no known allergies.  Review of Systems   Review of Systems  All other systems reviewed and are negative.   Physical Exam Updated Vital Signs BP 128/73   Pulse 78   Temp 98 F (36.7 C) (Oral)   Resp 17   SpO2 100%   Physical Exam Vitals and nursing note reviewed. Exam conducted with a chaperone present.  Constitutional:      General: She is not in acute distress.    Appearance: Normal appearance. She is not ill-appearing.  HENT:     Head: Normocephalic and atraumatic.  Eyes:     General: No scleral icterus.    Conjunctiva/sclera: Conjunctivae normal.  Cardiovascular:     Rate and Rhythm: Normal rate.     Pulses: Normal pulses.  Pulmonary:     Effort: Pulmonary effort is normal. No respiratory distress.  Abdominal:     General: Abdomen is flat. There is no distension.     Palpations: Abdomen is soft. There is no mass.     Tenderness: There is no abdominal tenderness. There is no guarding.     Hernia: No hernia is present.  Genitourinary:    Comments: Speculum exam: Normal external vagina.  Mild bleeding noted in vaginal vault coming from cervix.  Cervical os is closed.  No friability noted.  Pink. Bimanual exam: No adnexal tenderness or masses appreciated.  No CMT. Musculoskeletal:     Comments: Right foot: Pedal pulse intact and symmetric with contralateral foot.  Sensation intact throughout, including her affected second toe.  No overlying skin changes or cool to touch.  Can wiggle all toes.  ROM and strength intact  throughout.  Skin:    General: Skin is dry.     Capillary Refill: Capillary refill takes less than 2 seconds.  Neurological:     General: No focal deficit present.     Mental Status: She is alert and oriented to person, place, and time.     GCS: GCS eye subscore is 4. GCS verbal subscore is 5. GCS motor subscore is 6.     Sensory: No sensory deficit.  Psychiatric:  Mood and Affect: Mood normal.        Behavior: Behavior normal.        Thought Content: Thought content normal.     ED Results / Procedures / Treatments   Labs (all labs ordered are listed, but only abnormal results are displayed) Labs Reviewed  WET PREP, GENITAL - Abnormal; Notable for the following components:      Result Value   Clue Cells Wet Prep HPF POC PRESENT (*)    WBC, Wet Prep HPF POC FEW (*)    All other components within normal limits  COMPREHENSIVE METABOLIC PANEL - Abnormal; Notable for the following components:   BUN 5 (*)    AST 13 (*)    All other components within normal limits  URINALYSIS, ROUTINE W REFLEX MICROSCOPIC - Abnormal; Notable for the following components:   Hgb urine dipstick SMALL (*)    All other components within normal limits  LIPASE, BLOOD  CBC  RAPID HIV SCREEN (HIV 1/2 AB+AG)  RPR  I-STAT BETA HCG BLOOD, ED ([facility], [facility], [facility] ONLY)  GC/CHLAMYDIA PROBE AMP ([place]) NOT [facility]    EKG None  Radiology No results found.  Procedures Procedures (including critical care time)  Medications Ordered in ED Medications - No data to display  ED Course  I have reviewed the triage vital signs and the nursing notes.  Pertinent labs & imaging results that were available during my care of the patient were reviewed by me and considered in my medical decision making (see chart for details).    MDM Rules/Calculators/A&P                          She has multiple complaints, likely bedside for primary care or OB/GYN outpatient follow-up.  However, given her lightheadedness  and reports of intermittent vaginal bleeding, will obtain laboratory work-up.  She also is requesting STI testing because she had sex with a new partner.  No dysuria.  Denies any abnormal discharge or odor.  She denies any abdominal pain or pelvic discomfort.  Do not feel as though transvaginal ultrasound is emergently warranted at this time, especially given history concerning for irregular menses rather than constant heavy bleeding.  CBC reassuring.  Likely hormonal etiology for bleeding.    Labs CBC: No anemia or no leukocytosis concerning for infection. Lipase: Within normal notes. I-STAT beta-hCG: Less than 5. CMP: Entirely unremarkable. HIV: Pending. RPR: Pending. GC test: Pending. Wet prep: No yeast or trichomoniasis.  Clue cells present in addition to few WBC.  Given lack of any significant purulent discharge in vaginal vault on my exam and only mild bleeding, along with lack of any CMT or history particularly concerning for STI, will hold off on antibiotic treatment until results pend.  Despite clue cells on wet prep, patient is denying any vaginal discomfort or odor.  She understands to go to the Department of Health for STI treatment if her GC testing is positive.  We will refer patient to [facility] to get established with a primary care provider.  Patient is reassured by today's comprehensive work-up.  No interventions necessary.    ED return precautions discussed.  Patient voices understanding and is agreeable to the plan.    Final Clinical Impression(s) / ED Diagnoses Final diagnoses:  Vaginal bleeding    Rx / DC Orders ED Discharge Orders    None       Lorelee NewGreen, Lachae Hohler L,  PA-C 01/22/20 1332    Pricilla Loveless, MD 01/23/20 1410

## 2020-01-22 NOTE — Discharge Instructions (Addendum)
Your work-up today was largely unremarkable.  Your STI testing is all pending.  If you are called about a positive test, you'll need to go to the Department of Health in [place] for free STI testing/treatment.  As for your intermittent vaginal bleeding, I recommend getting established with an OB/GYN or primary care provider to discuss options.  This is likely hormone-related.  Emergent ultrasound to evaluate for structural abnormalities is not necessary at this time.  Your physical exam is reassuring.  I understand you're also concerned about possibly elevated blood pressures.  I agree that this is an issue that needs to be addressed, however your pressures here in the ED were within normal limits and I do not feel as though intervention at this time is warranted.  Instead, please get established with a primary care provider soon as possible.  Return to the ED or seek immediate attention should you experience any new or worsening symptoms.

## 2020-01-23 LAB — GC/CHLAMYDIA PROBE AMP (~~LOC~~) NOT AT ARMC
Chlamydia: NEGATIVE
Comment: NEGATIVE
Comment: NORMAL
Neisseria Gonorrhea: NEGATIVE

## 2020-01-23 LAB — RPR: RPR Ser Ql: NONREACTIVE

## 2020-02-09 ENCOUNTER — Emergency Department (HOSPITAL_COMMUNITY)
Admission: EM | Admit: 2020-02-09 | Discharge: 2020-02-09 | Disposition: A | Discharge status: Home / Self Care | Payer: Medicaid Other | Attending: Emergency Medicine | Admitting: Emergency Medicine

## 2020-02-09 ENCOUNTER — Encounter (HOSPITAL_COMMUNITY): Payer: Self-pay

## 2020-02-09 ENCOUNTER — Emergency Department (HOSPITAL_COMMUNITY): Payer: Medicaid Other

## 2020-02-09 DIAGNOSIS — S022XXA Fracture of nasal bones, initial encounter for closed fracture: Secondary | ICD-10-CM | POA: Insufficient documentation

## 2020-02-09 DIAGNOSIS — S0181XA Laceration without foreign body of other part of head, initial encounter: Secondary | ICD-10-CM | POA: Diagnosis not present

## 2020-02-09 DIAGNOSIS — Y92838 Other recreation area as the place of occurrence of the external cause: Secondary | ICD-10-CM | POA: Insufficient documentation

## 2020-02-09 DIAGNOSIS — S0992XA Unspecified injury of nose, initial encounter: Secondary | ICD-10-CM | POA: Diagnosis present

## 2020-02-09 DIAGNOSIS — Z87891 Personal history of nicotine dependence: Secondary | ICD-10-CM | POA: Diagnosis not present

## 2020-02-09 DIAGNOSIS — Z23 Encounter for immunization: Secondary | ICD-10-CM | POA: Insufficient documentation

## 2020-02-09 DIAGNOSIS — Y9341 Activity, dancing: Secondary | ICD-10-CM | POA: Insufficient documentation

## 2020-02-09 DIAGNOSIS — S0191XA Laceration without foreign body of unspecified part of head, initial encounter: Secondary | ICD-10-CM | POA: Diagnosis not present

## 2020-02-09 DIAGNOSIS — W01198A Fall on same level from slipping, tripping and stumbling with subsequent striking against other object, initial encounter: Secondary | ICD-10-CM | POA: Insufficient documentation

## 2020-02-09 DIAGNOSIS — S0031XA Abrasion of nose, initial encounter: Secondary | ICD-10-CM | POA: Diagnosis not present

## 2020-02-09 DIAGNOSIS — W19XXXA Unspecified fall, initial encounter: Secondary | ICD-10-CM

## 2020-02-09 DIAGNOSIS — S01412A Laceration without foreign body of left cheek and temporomandibular area, initial encounter: Secondary | ICD-10-CM | POA: Diagnosis not present

## 2020-02-09 DIAGNOSIS — J45909 Unspecified asthma, uncomplicated: Secondary | ICD-10-CM | POA: Insufficient documentation

## 2020-02-09 MED ORDER — IBUPROFEN 600 MG PO TABS: 600.0000 mg | ORAL_TABLET | Freq: Four times a day (QID) | ORAL | 0 refills | Status: AC | PRN

## 2020-02-09 MED ORDER — OXYMETAZOLINE HCL 0.05 % NA SOLN
1.0000 | Freq: Once | NASAL | Status: AC
  Administered 2020-02-09: 1 via NASAL
  Filled 2020-02-09: qty 30

## 2020-02-09 MED ORDER — ONDANSETRON 4 MG PO TBDP
4.0000 mg | ORAL_TABLET | Freq: Once | ORAL | Status: AC
  Administered 2020-02-09: 4 mg via ORAL
  Filled 2020-02-09: qty 1

## 2020-02-09 MED ORDER — HYDROCODONE-ACETAMINOPHEN 5-325 MG PO TABS
1.0000 | ORAL_TABLET | Freq: Four times a day (QID) | ORAL | 0 refills | Status: AC | PRN
Start: 2020-02-09 — End: ?

## 2020-02-09 MED ORDER — OXYCODONE-ACETAMINOPHEN 5-325 MG PO TABS
1.0000 | ORAL_TABLET | Freq: Once | ORAL | Status: AC
  Administered 2020-02-09: 1 via ORAL
  Filled 2020-02-09: qty 1

## 2020-02-09 MED ORDER — LIDOCAINE-EPINEPHRINE 1 %-1:100000 IJ SOLN
10.0000 mL | Freq: Once | INTRAMUSCULAR | Status: AC
  Administered 2020-02-09: 10 mL
  Filled 2020-02-09: qty 1

## 2020-02-09 MED ORDER — HYDROCODONE-ACETAMINOPHEN 5-325 MG PO TABS
1.0000 | ORAL_TABLET | Freq: Once | ORAL | Status: AC
  Administered 2020-02-09: 1 via ORAL
  Filled 2020-02-09: qty 1

## 2020-02-09 MED ORDER — TETANUS-DIPHTH-ACELL PERTUSSIS 5-2.5-18.5 LF-MCG/0.5 IM SUSY
0.5000 mL | PREFILLED_SYRINGE | Freq: Once | INTRAMUSCULAR | Status: AC
  Administered 2020-02-09: 0.5 mL via INTRAMUSCULAR
  Filled 2020-02-09: qty 0.5

## 2020-02-09 MED ORDER — IBUPROFEN 400 MG PO TABS
600.0000 mg | ORAL_TABLET | Freq: Once | ORAL | Status: AC
  Administered 2020-02-09: 600 mg via ORAL
  Filled 2020-02-09: qty 1

## 2020-02-09 NOTE — ED Triage Notes (Addendum)
Pt [place] the club and tripped and fell onto the cement. Pt has lac to left side of face. Pt has swelling to left side of face. Pt report she thinks she had LOC. Pt  Pt has  inch lac to side of face.Pt denies any having any alcohol tonight.Pt is not on any blood thinners.

## 2020-02-09 NOTE — ED Provider Notes (Signed)
[facility] EMERGENCY DEPARTMENT Provider Note   CSN: [id number] Arrival date & time: 02/09/20  0424     History Chief Complaint  Patient presents with  . Fall    Emily Welch is a 20 y.o. female.  Emily Welch is a 20 y.o. female with a history of asthma, and depression, who presents to the emergency department for evaluation of for fall and facial injury.  Patient reports she was dancing at the club when she tripped on a rolled up carpet and fell forward striking the left side of her face on the corner of a table.  She reports she had a cut to the face and immediately noticed bleeding, was also having bleeding from her nose and pain to her nose.  Bleeding from the nose stopped prior to arrival.  She did not lose consciousness.  Reports no headache just facial pain.  Reports some swelling around her left eye but no eye pain or changes in vision.  Denies any other injuries from fall, no neck or back pain, no pain in the chest, or abdomen.  Denies any pain over her extremities.  Reports cut on her face has continued to bleed intermittently.  No blood thinners.  Unsure of last tetanus vaccination.  No other aggravating or alleviating factors.        Past Medical History:  Diagnosis Date  . Asthma   . Depression    gets emotional, meds helping  . Infection    UTI    Patient Active Problem List   Diagnosis Date Noted  . History of major depression 09/17/2019  . Encounter for postpartum visit 07/14/2019  . Post-partum depression 07/14/2019  . History of behavioral and mental health problems 07/14/2019  . Nexplanon insertion 06/08/2019  . Gestational hypertension 06/06/2019  . IUGR (intrauterine growth restriction) affecting care of mother 06/04/2019  . Major depressive disorder with current active episode 02/27/2019    Past Surgical History:  Procedure Laterality Date  . CESAREAN SECTION N/A 06/06/2019   Procedure: CESAREAN SECTION;  Surgeon:  Adam Phenix, MD;  Location: [facility] LD ORS;  Service: Obstetrics;  Laterality: N/A;  . NO PAST SURGERIES       OB History    Gravida  1   Para  1   Term      Preterm  1   AB      Living  1     SAB      IAB      Ectopic      Multiple  0   Live Births  1           Family History  Problem Relation Age of Onset  . Heart disease Father   . Diabetes Sister     Social History   Tobacco Use  . Smoking status: Former Smoker    Types: Cigarettes  . Smokeless tobacco: Former Neurosurgeon  . Tobacco comment: quit with preg  Vaping Use  . Vaping Use: Never used  Substance Use Topics  . Alcohol use: Not Currently  . Drug use: Yes    Types: Marijuana    Comment: last used 05/26/19    Home Medications Prior to Admission medications   Medication Sig Start Date End Date Taking? Authorizing Provider  HYDROcodone-acetaminophen (NORCO) 5-325 MG tablet Take 1 tablet by mouth every 6 (six) hours as needed. 02/09/20  Yes Dartha Lodge, PA-C  ibuprofen (ADVIL) 600 MG tablet Take 1 tablet (600 mg total) by  mouth every 6 (six) hours as needed. 02/09/20  Yes Dartha Lodge, PA-C  acetaminophen (TYLENOL) 500 MG tablet Take 1,000 mg by mouth every 6 (six) hours as needed for mild pain or headache.    [provider]  amLODipine (NORVASC) 10 MG tablet Take 1 tablet (10 mg total) by mouth daily. 06/09/19   Anyanwu, Jethro Bastos, MD  ARIPiprazole (ABILIFY) 5 MG tablet Take 1 tablet (5 mg total) by mouth daily. 10/17/19   Money, Gerlene Burdock, FNP  Blood Pressure Monitoring (BLOOD PRESSURE MONITOR AUTOMAT) DEVI 1 Device by Does not apply route daily. Automatic Blood pressure cuff regular or large size. Monitor blood pressure regularly at home. ICD-10 Code: O09.90 01/08/19   Raelyn Mora, CNM  docusate sodium (COLACE) 100 MG capsule Take 1 capsule (100 mg total) by mouth 2 (two) times daily as needed for mild constipation or moderate constipation. 06/09/19   Anyanwu, Jethro Bastos, MD  FLUoxetine  (PROZAC) 40 MG capsule Take 1 capsule (40 mg total) by mouth daily. 03/06/19 03/05/20  Gerrit Heck, CNM  oxyCODONE (OXY IR/ROXICODONE) 5 MG immediate release tablet Take 1 tablet (5 mg total) by mouth every 4 (four) hours as needed for severe pain. 06/09/19   Anyanwu, Jethro Bastos, MD  Prenatal MV & Min w/FA-DHA (PRENATAL ADULT GUMMY/DHA/FA) 0.4-25 MG CHEW Chew 1 tablet by mouth daily. 12/03/18   Raelyn Mora, CNM    Allergies    Patient has no known allergies.  Review of Systems   Review of Systems  Constitutional: Negative for chills and fever.  HENT: Positive for facial swelling and nosebleeds.   Eyes: Negative for visual disturbance.  Respiratory: Negative for shortness of breath.   Cardiovascular: Negative for chest pain.  Gastrointestinal: Negative for abdominal pain, nausea and vomiting.  Musculoskeletal: Negative for arthralgias, back pain, myalgias and neck pain.  Skin: Positive for wound. Negative for color change.  Neurological: Positive for headaches.  All other systems reviewed and are negative.   Physical Exam Updated Vital Signs BP 100/65 (BP Location: Right Arm)   Pulse 62   Temp 98.2 F (36.8 C) (Oral)   Resp 16   Ht 5' (1.524 m)   Wt 59 kg   LMP 02/09/2020   SpO2 96%   BMI 25.39 kg/m   Physical Exam Vitals and nursing note reviewed.  Constitutional:      General: She is not in acute distress.    Appearance: She is well-developed and well-nourished. She is not diaphoretic.  HENT:     Head: Normocephalic.     Comments: Trauma to the left side of the face with some swelling to the left cheek and around the left eye as well as swelling and deformity to the nose, no evident scalp trauma, no hematoma, step-off or deformity. 3.5 cm laceration to the left cheek with some smaller very superficial abrasions present closer to the nose.    Nose:     Comments: Nose with swelling and slight deformity, there is some dried blood in bilateral nares but no evidence of  septal hematoma and no active bleeding    Mouth/Throat:     Mouth: Oropharynx is clear and moist.     Comments: Tooth #7 is broken but is still attached at the root, there is no bleeding, no tenderness over the gumline, no other broken or loose teeth noted, no tongue bite.  No jaw tenderness or malocclusion of the jaw. Eyes:     General:  Right eye: No discharge.        Left eye: No discharge.     Extraocular Movements: Extraocular movements intact and EOM normal.     Conjunctiva/sclera: Conjunctivae normal.     Pupils: Pupils are equal, round, and reactive to light.     Comments: There is some swelling and mild tenderness surrounding the left eye no palpable step-off or deformity, PERRLA, EOMI, no erythema of the eye.  Neck:     Comments: No midline C-spine tenderness, normal range of motion. Cardiovascular:     Rate and Rhythm: Normal rate and regular rhythm.     Pulses: Intact distal pulses.     Heart sounds: Normal heart sounds. No murmur heard. No friction rub. No gallop.   Pulmonary:     Effort: Pulmonary effort is normal. No respiratory distress.     Breath sounds: Normal breath sounds. No wheezing or rales.  Abdominal:     General: Bowel sounds are normal. There is no distension.     Palpations: Abdomen is soft. There is no mass.     Tenderness: There is no abdominal tenderness. There is no guarding.  Musculoskeletal:        General: No deformity or edema.     Cervical back: Neck supple.  Skin:    General: Skin is warm and dry.     Capillary Refill: Capillary refill takes less than 2 seconds.  Neurological:     Mental Status: She is alert and oriented to person, place, and time.     Coordination: Coordination normal.     Comments: Speech is clear, able to follow commands CN III-XII intact Normal strength in upper and lower extremities bilaterally including dorsiflexion and plantar flexion, strong and equal grip strength Sensation normal to light and sharp  touch Moves extremities without ataxia, coordination intact   Psychiatric:        Mood and Affect: Mood normal.        Behavior: Behavior normal.       ED Results / Procedures / Treatments   Labs (all labs ordered are listed, but only abnormal results are displayed) Labs Reviewed - No data to display  EKG None  Radiology CT Head Wo Contrast  Result Date: 02/09/2020 CLINICAL DATA:  20 year old female status post fall onto a concrete. Left facial injury, laceration. EXAM: CT HEAD WITHOUT CONTRAST CT MAXILLOFACIAL WITHOUT CONTRAST TECHNIQUE: Multidetector CT imaging of the head and maxillofacial structures were performed using the standard protocol without intravenous contrast. Multiplanar CT image reconstructions of the maxillofacial structures were also generated. COMPARISON:  None. FINDINGS: CT HEAD FINDINGS Brain: Normal cerebral volume. No midline shift, ventriculomegaly, mass effect, evidence of mass lesion, intracranial hemorrhage or evidence of cortically based acute infarction. Gray-white matter differentiation is within normal limits throughout the brain. Vascular: No suspicious intracranial vascular hyperdensity. Skull: Intact calvarium.  No skull fracture identified. Other: No scalp soft tissue injury identified. CT MAXILLOFACIAL FINDINGS Osseous: Mandible intact and normally located. Carious anterior dentition. Maxilla, zygoma, central skull base and visible cervical vertebrae appear intact. Mildly displaced left nasal bone fracture (series 8, image 46). Orbits: Intact orbital walls. Globes appear intact. There is asymmetric left orbit preseptal soft tissue thickening and stranding, mild. Similar soft tissue asymmetry of the left premalar space. Possible skin laceration on the left series 7 image 45. No soft tissue gas or fluid collection. Intraorbital soft tissues remain symmetric, normal. Sinuses: Clear throughout. Soft tissues: Left face superficial soft tissue injury described  above. Negative  visible noncontrast deep soft tissue spaces of the face and neck. Upper cervical lymph nodes appear within normal limits. There is some elongation of bilateral stylohyoid ligament calcification. IMPRESSION: 1. Mildly displaced left nasal bone fracture with regional superficial soft tissue injury to the left face, preseptal space of the left orbit. 2. No other fracture or acute traumatic injury identified in the head or face. 3. Normal noncontrast CT appearance of the brain. 4. Carious anterior dentition. Electronically Signed   By: Odessa FlemingH  Hall M.D.   On: 02/09/2020 05:53   CT Maxillofacial Wo Contrast  Result Date: 02/09/2020 CLINICAL DATA:  20 year old female status post fall onto a concrete. Left facial injury, laceration. EXAM: CT HEAD WITHOUT CONTRAST CT MAXILLOFACIAL WITHOUT CONTRAST TECHNIQUE: Multidetector CT imaging of the head and maxillofacial structures were performed using the standard protocol without intravenous contrast. Multiplanar CT image reconstructions of the maxillofacial structures were also generated. COMPARISON:  None. FINDINGS: CT HEAD FINDINGS Brain: Normal cerebral volume. No midline shift, ventriculomegaly, mass effect, evidence of mass lesion, intracranial hemorrhage or evidence of cortically based acute infarction. Gray-white matter differentiation is within normal limits throughout the brain. Vascular: No suspicious intracranial vascular hyperdensity. Skull: Intact calvarium.  No skull fracture identified. Other: No scalp soft tissue injury identified. CT MAXILLOFACIAL FINDINGS Osseous: Mandible intact and normally located. Carious anterior dentition. Maxilla, zygoma, central skull base and visible cervical vertebrae appear intact. Mildly displaced left nasal bone fracture (series 8, image 46). Orbits: Intact orbital walls. Globes appear intact. There is asymmetric left orbit preseptal soft tissue thickening and stranding, mild. Similar soft tissue asymmetry of the left  premalar space. Possible skin laceration on the left series 7 image 45. No soft tissue gas or fluid collection. Intraorbital soft tissues remain symmetric, normal. Sinuses: Clear throughout. Soft tissues: Left face superficial soft tissue injury described above. Negative visible noncontrast deep soft tissue spaces of the face and neck. Upper cervical lymph nodes appear within normal limits. There is some elongation of bilateral stylohyoid ligament calcification. IMPRESSION: 1. Mildly displaced left nasal bone fracture with regional superficial soft tissue injury to the left face, preseptal space of the left orbit. 2. No other fracture or acute traumatic injury identified in the head or face. 3. Normal noncontrast CT appearance of the brain. 4. Carious anterior dentition. Electronically Signed   By: Odessa FlemingH  Hall M.D.   On: 02/09/2020 05:53    Procedures .Marland Kitchen.Laceration Repair  Date/Time: 02/09/2020 11:37 AM Performed by: Dartha LodgeFord, Takima Encina N, PA-C Authorized by: Dartha LodgeFord, Nadiah Corbit N, PA-C   Consent:    Consent obtained:  Verbal   Consent given by:  Patient   Risks discussed:  Infection, poor cosmetic result, pain, need for additional repair and poor wound healing   Alternatives discussed:  No treatment Universal protocol:    Imaging studies available: yes     Patient identity confirmed:  Verbally with patient Anesthesia:    Anesthesia method:  Local infiltration   Local anesthetic:  Lidocaine 2% WITH epi Laceration details:    Location:  Face   Face location:  L cheek   Length (cm):  3.5   Depth (mm):  5 Pre-procedure details:    Preparation:  Patient was prepped and draped in usual sterile fashion and imaging obtained to evaluate for foreign bodies Exploration:    Hemostasis achieved with:  Direct pressure and epinephrine   Imaging outcome: foreign body not noted     Wound exploration: entire depth of wound visualized     Wound extent: areolar  tissue violated   Treatment:    Area cleansed with:   Chlorhexidine and saline   Amount of cleaning:  Standard   Irrigation solution:  Sterile saline   Irrigation method:  Syringe   Debridement:  None Skin repair:    Repair method:  Sutures   Suture size:  6-0   Wound skin closure material used: vicryl.   Suture technique:  Simple interrupted   Number of sutures:  8 Approximation:    Approximation:  Close Repair type:    Repair type:  Simple Post-procedure details:    Dressing:  Non-adherent dressing   Procedure completion:  Tolerated well, no immediate complications   (including critical care time)  Medications Ordered in ED Medications  ondansetron (ZOFRAN-ODT) disintegrating tablet 4 mg (4 mg Oral Given 02/09/20 0455)  oxyCODONE-acetaminophen (PERCOCET/ROXICET) 5-325 MG per tablet 1 tablet (1 tablet Oral Given 02/09/20 0455)  lidocaine-EPINEPHrine (XYLOCAINE W/EPI) 1 %-1:100000 (with pres) injection 10 mL (10 mLs Infiltration Given 02/09/20 1039)  Tdap (BOOSTRIX) injection 0.5 mL (0.5 mLs Intramuscular Given 02/09/20 1008)  ibuprofen (ADVIL) tablet 600 mg (600 mg Oral Given 02/09/20 1008)  oxymetazoline (AFRIN) 0.05 % nasal spray 1 spray (1 spray Each Nare Given 02/09/20 1008)  HYDROcodone-acetaminophen (NORCO/VICODIN) 5-325 MG per tablet 1 tablet (1 tablet Oral Given 02/09/20 1154)    ED Course  I have reviewed the triage vital signs and the nursing notes.  Pertinent labs & imaging results that were available during my care of the patient were reviewed by me and considered in my medical decision making (see chart for details).    MDM Rules/Calculators/A&P                         Patient presents after a fall where she struck her face on the corner of a table.  She has a laceration to the left cheek with some smaller surrounding superficial abrasions, has swelling and deformity to the nose and swelling around the left eye.  CT of the head and maxillofacial bones ordered from triage.  CTs show no acute intracranial abnormality,  patient does have a mildly displaced left nasal bone fracture, no associated orbital fracture.  Laceration is not over fracture, no signs of open fracture.  On exam she has some periorbital swelling but there is no evidence of orbital fracture, external ocular movements are intact.  She does have a broken tooth but no evidence of alveolar ridge fracture or jaw fracture on CT, tooth is still intact at the root and there is no surrounding bleeding.  Cheek laceration was cleaned, anesthetized and repaired with Vicryl simple interrupted sutures with good cosmesis and hemostasis.  Tetanus updated.  Nonadherent dressing applied.  Discussed appropriate wound care and return precautions  Will have patient follow-up with ENT regarding nasal fracture and I asked her to contact her dentist regarding broken tooth.  Discussed using ice, ibuprofen and Norco for breakthrough pain.  Return precautions provided.  Patient counseled on avoiding any nose blowing.  She expresses understanding and agreement with this plan.  Discharged home in good condition.  Final Clinical Impression(s) / ED Diagnoses Final diagnoses:  Fall, initial encounter  Closed fracture of nasal bone, initial encounter  Facial laceration, initial encounter    Rx / DC Orders ED Discharge Orders         Ordered    HYDROcodone-acetaminophen (NORCO) 5-325 MG tablet  Every 6 hours PRN        02/09/20 1147  ibuprofen (ADVIL) 600 MG tablet  Every 6 hours PRN        02/09/20 1147           Dartha Lodge, [place] 02/09/20 1213    Gwyneth Sprout, MD 02/13/20 1524

## 2020-02-09 NOTE — ED Notes (Signed)
Spoke with MD Pilar Plate and agreed that CT head and Maxillofacial is appropriate orders at this time.

## 2020-02-09 NOTE — Discharge Instructions (Signed)
You have a nasal bone fracture on the left, you will need to follow-up with ENT in a few days after swelling is improved.  Please call the phone number on your paperwork on Tuesday to schedule this appointment.  You can apply ice to help with swelling.  Use nasal spray provided to you today twice a day for the next 3 days, do not continue to use after that or it can cause worsening congestion.  You can wipe your nose.  Please avoid any nose blowing.  Your facial laceration was closed with dissolvable sutures, after about 7 to 10 days they should start to become brittle and fall out on their own, if they do not you can gently massage them with a warm wet washcloth to help them fall out.  He may develop some bruising around this laceration, you can apply ice.  Monitor closely for surrounding redness, warmth or drainage as these of signs of infection.  For pain you can use ibuprofen 600 mg every 6 hours, for severe breakthrough pain you can use prescribed Norco, this is a narcotic pain medication and can cause drowsiness, do not drive after taking this medication.  Return for any new or worsening symptoms.

## 2020-05-31 DIAGNOSIS — N200 Calculus of kidney: Secondary | ICD-10-CM | POA: Diagnosis not present

## 2020-05-31 DIAGNOSIS — R3915 Urgency of urination: Secondary | ICD-10-CM | POA: Diagnosis not present

## 2020-05-31 DIAGNOSIS — M545 Low back pain, unspecified: Secondary | ICD-10-CM | POA: Diagnosis not present

## 2020-07-19 DIAGNOSIS — N939 Abnormal uterine and vaginal bleeding, unspecified: Secondary | ICD-10-CM | POA: Diagnosis not present

## 2020-07-23 DIAGNOSIS — N924 Excessive bleeding in the premenopausal period: Secondary | ICD-10-CM | POA: Diagnosis not present

## 2020-09-01 DIAGNOSIS — R110 Nausea: Secondary | ICD-10-CM | POA: Diagnosis not present

## 2020-09-01 DIAGNOSIS — R1011 Right upper quadrant pain: Secondary | ICD-10-CM | POA: Diagnosis not present

## 2020-09-01 DIAGNOSIS — R1013 Epigastric pain: Secondary | ICD-10-CM | POA: Diagnosis not present

## 2020-09-01 DIAGNOSIS — K760 Fatty (change of) liver, not elsewhere classified: Secondary | ICD-10-CM | POA: Diagnosis not present

## 2020-09-01 DIAGNOSIS — R1031 Right lower quadrant pain: Secondary | ICD-10-CM | POA: Diagnosis not present

## 2020-09-02 DIAGNOSIS — R1031 Right lower quadrant pain: Secondary | ICD-10-CM | POA: Diagnosis not present

## 2020-09-03 DIAGNOSIS — R110 Nausea: Secondary | ICD-10-CM | POA: Diagnosis not present

## 2020-09-03 DIAGNOSIS — R1031 Right lower quadrant pain: Secondary | ICD-10-CM | POA: Diagnosis not present

## 2020-09-29 DIAGNOSIS — F322 Major depressive disorder, single episode, severe without psychotic features: Secondary | ICD-10-CM | POA: Diagnosis not present

## 2020-10-06 DIAGNOSIS — F322 Major depressive disorder, single episode, severe without psychotic features: Secondary | ICD-10-CM | POA: Diagnosis not present

## 2020-11-04 DIAGNOSIS — R112 Nausea with vomiting, unspecified: Secondary | ICD-10-CM | POA: Diagnosis not present

## 2020-11-04 DIAGNOSIS — E162 Hypoglycemia, unspecified: Secondary | ICD-10-CM | POA: Diagnosis not present

## 2021-02-18 DIAGNOSIS — Z3046 Encounter for surveillance of implantable subdermal contraceptive: Secondary | ICD-10-CM | POA: Diagnosis not present

## 2021-02-22 DIAGNOSIS — Z0000 Encounter for general adult medical examination without abnormal findings: Secondary | ICD-10-CM | POA: Diagnosis not present

## 2021-02-22 DIAGNOSIS — Z113 Encounter for screening for infections with a predominantly sexual mode of transmission: Secondary | ICD-10-CM | POA: Diagnosis not present

## 2021-02-26 IMAGING — CT CT HEAD W/O CM
3 of 7 series · 15 of 47 positions shown, 18 images · non-contrast
Comparison: None.

CLINICAL DATA: 19-year-old female status post fall onto a concrete.
Left facial injury, laceration.

EXAM:
CT HEAD WITHOUT CONTRAST
CT MAXILLOFACIAL WITHOUT CONTRAST
TECHNIQUE: Multidetector CT imaging of the head and maxillofacial structures
were performed using the standard protocol without intravenous
contrast. Multiplanar CT image reconstructions of the maxillofacial
structures were also generated.

[Series 4: head 2.0 h70h · axial · 0.42mm/px · z∈[-157,-29]mm · 10 of 78 slices shown, 13 images]
[im 7/78  brain]
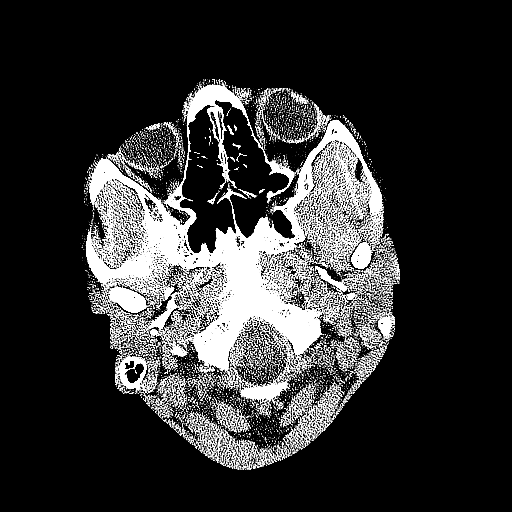
[im 7/78  bone]
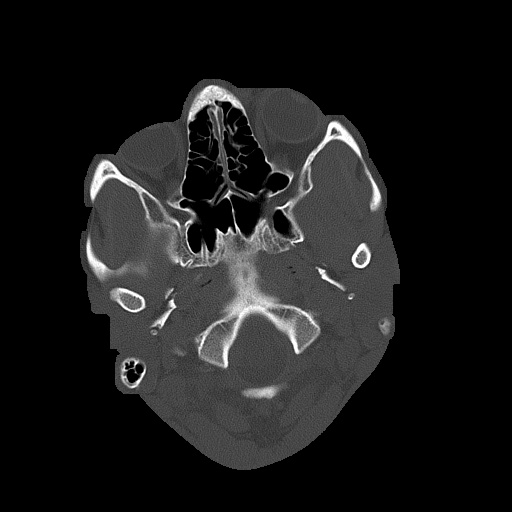
[im 13/78  brain]
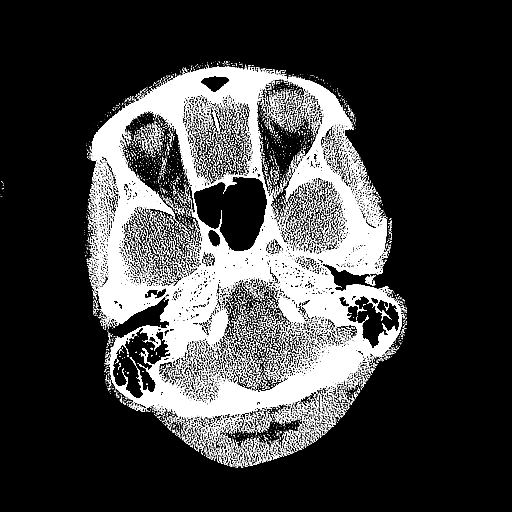
[im 20/78  brain]
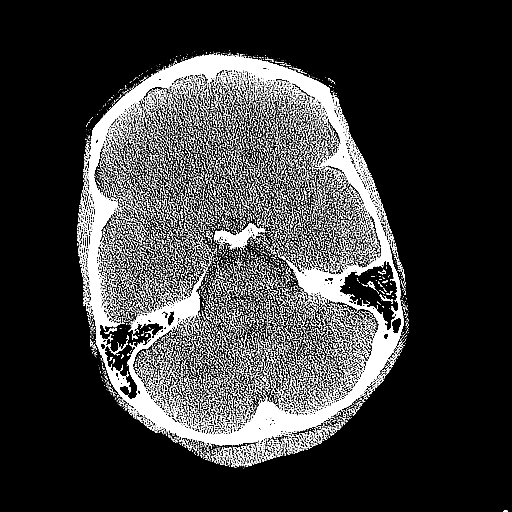
[im 26/78  brain]
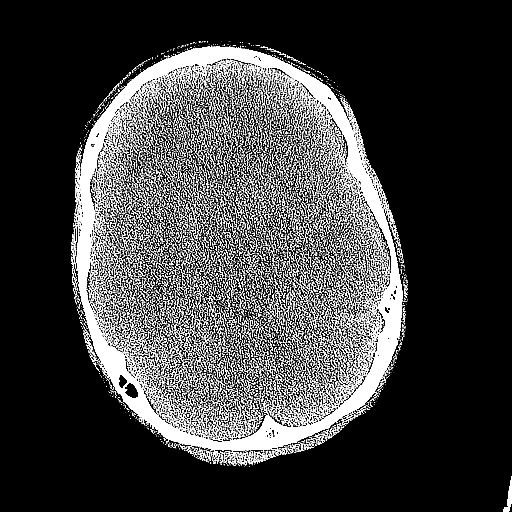
[im 33/78  brain]
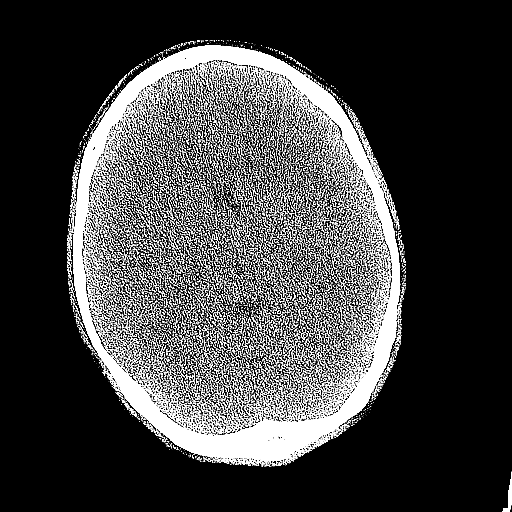
[im 33/78  bone]
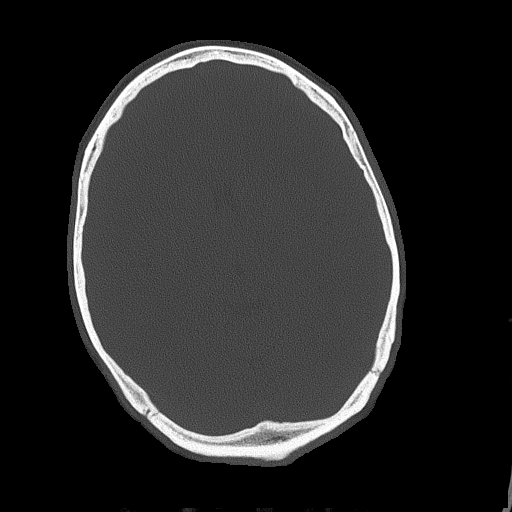
[im 45/78  brain]
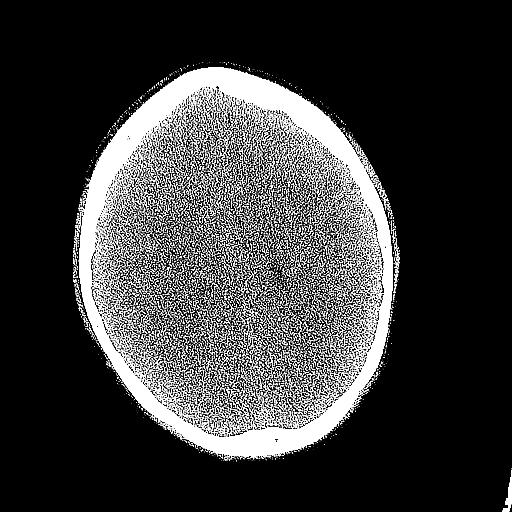
[im 52/78  brain]
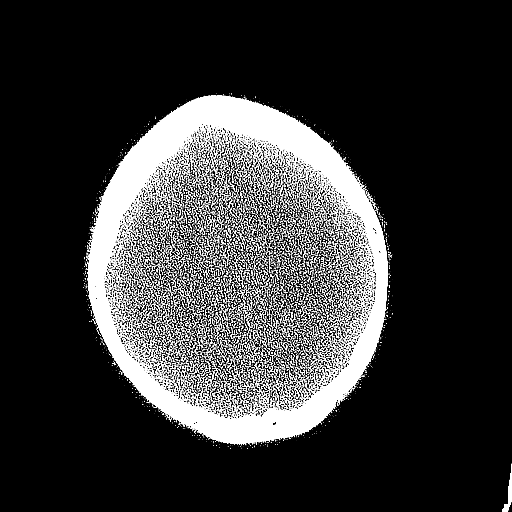
[im 58/78  brain]
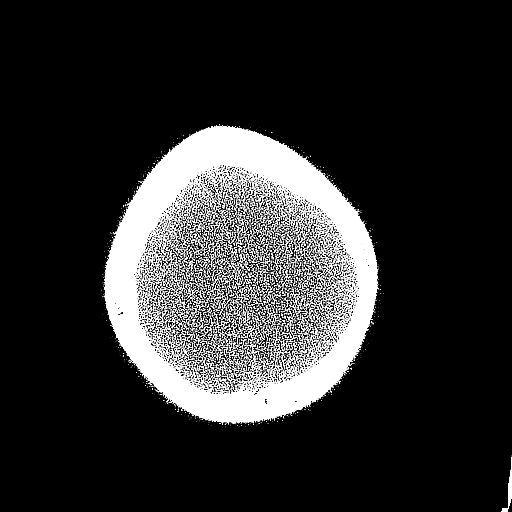
[im 65/78  brain]
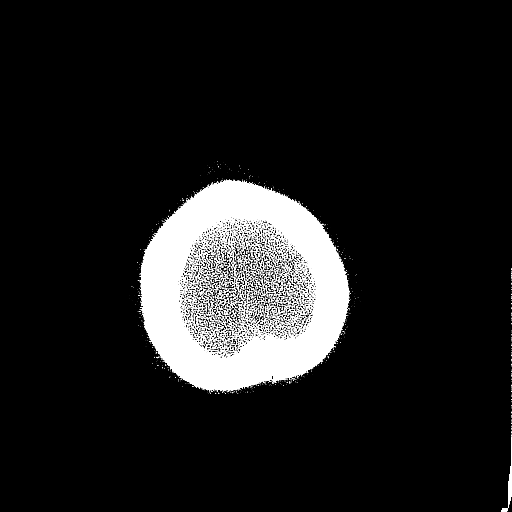
[im 65/78  bone]
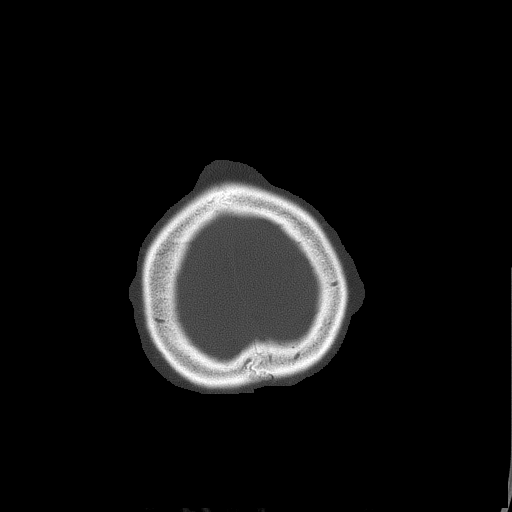
[im 71/78  brain]
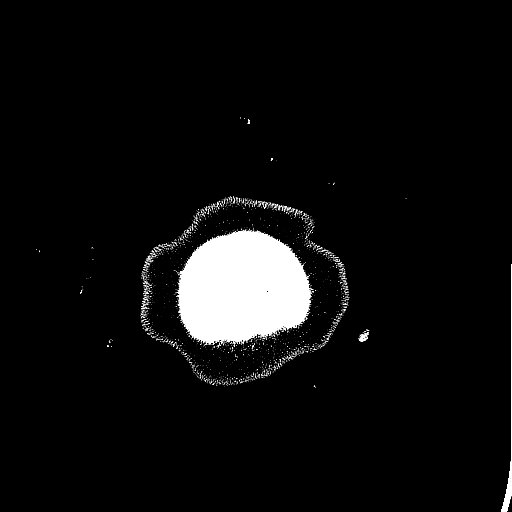

[Series 11: coronal soft tissue · coronal · 0.30mm/px · 3 of 76 slices shown]
[im 26/76  brain]
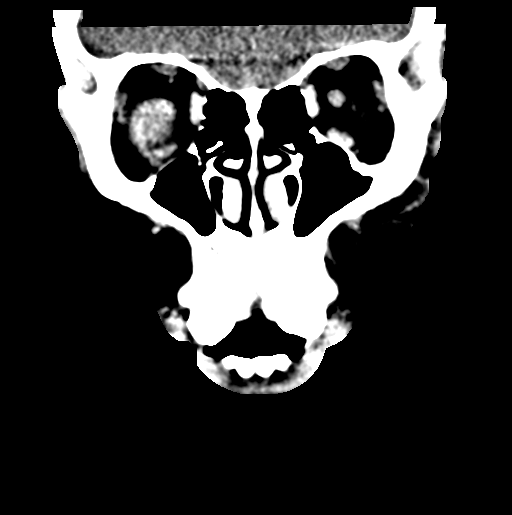
[im 38/76  brain]
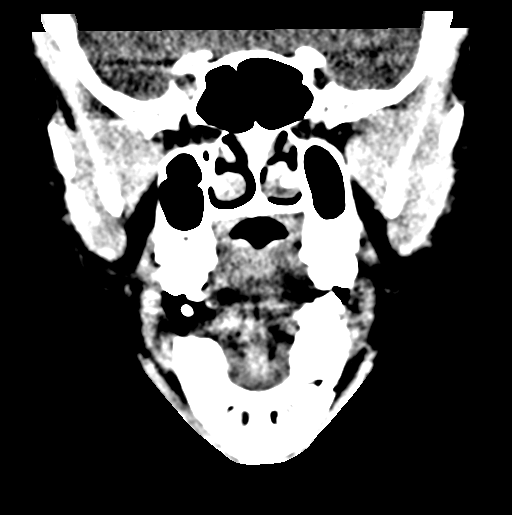
[im 51/76  brain]
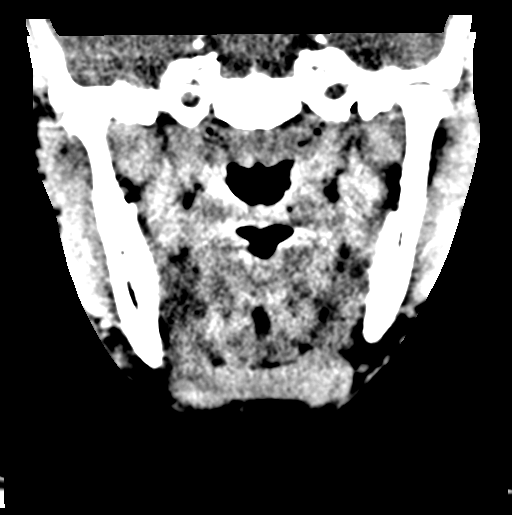

[Series 12: sagittal soft tissue · sagittal · 0.32mm/px · 2 of 76 slices shown]
[im 26/76  brain]
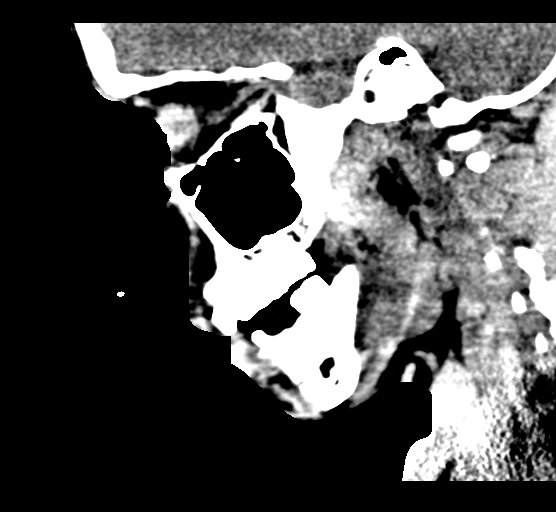
[im 51/76  brain]
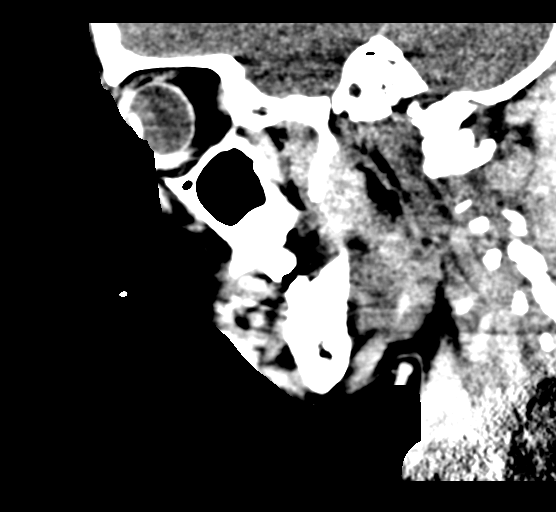

[15 of 47 positions shown; findings below may reference images not displayed]

FINDINGS: CT HEAD FINDINGS

Brain: Normal cerebral volume. No midline shift, ventriculomegaly,
mass effect, evidence of mass lesion, intracranial hemorrhage or
evidence of cortically based acute infarction. Gray-white matter
differentiation is within normal limits throughout the brain.

Vascular: No suspicious intracranial vascular hyperdensity.

Skull: Intact calvarium.  No skull fracture identified.

Other: No scalp soft tissue injury identified.

CT MAXILLOFACIAL FINDINGS

Osseous: Mandible intact and normally located. Carious anterior
dentition. Maxilla, zygoma, central skull base and visible cervical
vertebrae appear intact.

Mildly displaced left nasal bone fracture (series 8, image 46).

Orbits: Intact orbital walls. Globes appear intact. There is
asymmetric left orbit preseptal soft tissue thickening and
stranding, mild. Similar soft tissue asymmetry of the left premalar
space. Possible skin laceration on the left series 7 image 45. No
soft tissue gas or fluid collection.

Intraorbital soft tissues remain symmetric, normal.

Sinuses: Clear throughout.

Soft tissues: Left face superficial soft tissue injury described
above. Negative visible noncontrast deep soft tissue spaces of the
face and neck. Upper cervical lymph nodes appear within normal
limits.

There is some elongation of bilateral stylohyoid ligament
calcification.
IMPRESSION: 1. Mildly displaced left nasal bone fracture with regional
superficial soft tissue injury to the left face, preseptal space of
the left orbit.
2. No other fracture or acute traumatic injury identified in the
head or face.
3. Normal noncontrast CT appearance of the brain.
4. Carious anterior dentition.

## 2021-02-28 ENCOUNTER — Other Ambulatory Visit (HOSPITAL_COMMUNITY): Payer: Self-pay

## 2021-03-05 ENCOUNTER — Other Ambulatory Visit (HOSPITAL_COMMUNITY): Payer: Self-pay

## 2021-03-05 MED ORDER — FLUOXETINE HCL 40 MG PO CAPS
40.0000 mg | ORAL_CAPSULE | Freq: Every day | ORAL | 2 refills | Status: AC
Start: 2021-03-05 — End: 2022-03-05
  Filled 2021-03-05 – 2021-08-11 (×2): qty 30, 30d supply, fill #0

## 2021-03-18 ENCOUNTER — Other Ambulatory Visit (HOSPITAL_COMMUNITY): Payer: Self-pay

## 2021-03-25 ENCOUNTER — Other Ambulatory Visit (HOSPITAL_COMMUNITY): Payer: Self-pay

## 2021-04-02 ENCOUNTER — Other Ambulatory Visit (HOSPITAL_COMMUNITY): Payer: Self-pay

## 2021-06-28 DIAGNOSIS — R1013 Epigastric pain: Secondary | ICD-10-CM | POA: Diagnosis not present

## 2021-06-28 DIAGNOSIS — R112 Nausea with vomiting, unspecified: Secondary | ICD-10-CM | POA: Diagnosis not present

## 2021-06-28 DIAGNOSIS — B9689 Other specified bacterial agents as the cause of diseases classified elsewhere: Secondary | ICD-10-CM | POA: Diagnosis not present

## 2021-06-28 DIAGNOSIS — N760 Acute vaginitis: Secondary | ICD-10-CM | POA: Diagnosis not present

## 2021-07-25 DIAGNOSIS — S161XXA Strain of muscle, fascia and tendon at neck level, initial encounter: Secondary | ICD-10-CM | POA: Diagnosis not present

## 2021-07-25 DIAGNOSIS — M546 Pain in thoracic spine: Secondary | ICD-10-CM | POA: Diagnosis not present

## 2021-07-25 DIAGNOSIS — M542 Cervicalgia: Secondary | ICD-10-CM | POA: Diagnosis not present

## 2021-07-25 DIAGNOSIS — S39012A Strain of muscle, fascia and tendon of lower back, initial encounter: Secondary | ICD-10-CM | POA: Diagnosis not present

## 2021-07-25 DIAGNOSIS — M545 Low back pain, unspecified: Secondary | ICD-10-CM | POA: Diagnosis not present

## 2021-07-25 DIAGNOSIS — M25511 Pain in right shoulder: Secondary | ICD-10-CM | POA: Diagnosis not present

## 2021-07-25 DIAGNOSIS — S46911A Strain of unspecified muscle, fascia and tendon at shoulder and upper arm level, right arm, initial encounter: Secondary | ICD-10-CM | POA: Diagnosis not present

## 2021-08-11 ENCOUNTER — Other Ambulatory Visit (HOSPITAL_COMMUNITY): Payer: Self-pay

## 2021-08-24 DIAGNOSIS — R079 Chest pain, unspecified: Secondary | ICD-10-CM | POA: Diagnosis not present

## 2021-08-24 DIAGNOSIS — R141 Gas pain: Secondary | ICD-10-CM | POA: Diagnosis not present

## 2021-08-24 DIAGNOSIS — R110 Nausea: Secondary | ICD-10-CM | POA: Diagnosis not present

## 2021-08-24 DIAGNOSIS — K219 Gastro-esophageal reflux disease without esophagitis: Secondary | ICD-10-CM | POA: Diagnosis not present

## 2021-08-25 ENCOUNTER — Other Ambulatory Visit (HOSPITAL_COMMUNITY): Payer: Self-pay

## 2021-08-26 DIAGNOSIS — F322 Major depressive disorder, single episode, severe without psychotic features: Secondary | ICD-10-CM | POA: Diagnosis not present

## 2021-09-20 DIAGNOSIS — A64 Unspecified sexually transmitted disease: Secondary | ICD-10-CM | POA: Diagnosis not present

## 2021-10-06 DIAGNOSIS — Z3009 Encounter for other general counseling and advice on contraception: Secondary | ICD-10-CM | POA: Diagnosis not present

## 2021-10-06 DIAGNOSIS — Z1159 Encounter for screening for other viral diseases: Secondary | ICD-10-CM | POA: Diagnosis not present

## 2021-10-06 DIAGNOSIS — Z0000 Encounter for general adult medical examination without abnormal findings: Secondary | ICD-10-CM | POA: Diagnosis not present

## 2021-10-06 DIAGNOSIS — R300 Dysuria: Secondary | ICD-10-CM | POA: Diagnosis not present

## 2021-10-06 DIAGNOSIS — Z114 Encounter for screening for human immunodeficiency virus [HIV]: Secondary | ICD-10-CM | POA: Diagnosis not present

## 2021-10-06 DIAGNOSIS — Z113 Encounter for screening for infections with a predominantly sexual mode of transmission: Secondary | ICD-10-CM | POA: Diagnosis not present

## 2021-10-25 DIAGNOSIS — M25512 Pain in left shoulder: Secondary | ICD-10-CM | POA: Diagnosis not present

## 2021-10-25 DIAGNOSIS — M25522 Pain in left elbow: Secondary | ICD-10-CM | POA: Diagnosis not present

## 2021-10-25 DIAGNOSIS — R600 Localized edema: Secondary | ICD-10-CM | POA: Diagnosis not present

## 2022-01-07 DIAGNOSIS — N760 Acute vaginitis: Secondary | ICD-10-CM | POA: Diagnosis not present

## 2022-01-07 DIAGNOSIS — B9689 Other specified bacterial agents as the cause of diseases classified elsewhere: Secondary | ICD-10-CM | POA: Diagnosis not present

## 2022-02-02 DIAGNOSIS — H5213 Myopia, bilateral: Secondary | ICD-10-CM | POA: Diagnosis not present

## 2022-02-24 DIAGNOSIS — H5203 Hypermetropia, bilateral: Secondary | ICD-10-CM | POA: Diagnosis not present

## 2022-03-29 DIAGNOSIS — F411 Generalized anxiety disorder: Secondary | ICD-10-CM | POA: Diagnosis not present

## 2022-03-29 DIAGNOSIS — F3181 Bipolar II disorder: Secondary | ICD-10-CM | POA: Diagnosis not present

## 2022-03-29 DIAGNOSIS — F331 Major depressive disorder, recurrent, moderate: Secondary | ICD-10-CM | POA: Diagnosis not present

## 2022-03-29 DIAGNOSIS — F4312 Post-traumatic stress disorder, chronic: Secondary | ICD-10-CM | POA: Diagnosis not present

## 2022-04-11 DIAGNOSIS — K029 Dental caries, unspecified: Secondary | ICD-10-CM | POA: Diagnosis not present

## 2022-04-11 DIAGNOSIS — R300 Dysuria: Secondary | ICD-10-CM | POA: Diagnosis not present

## 2022-04-11 DIAGNOSIS — K0889 Other specified disorders of teeth and supporting structures: Secondary | ICD-10-CM | POA: Diagnosis not present

## 2022-04-25 DIAGNOSIS — Z202 Contact with and (suspected) exposure to infections with a predominantly sexual mode of transmission: Secondary | ICD-10-CM | POA: Diagnosis not present

## 2022-05-25 DIAGNOSIS — Z202 Contact with and (suspected) exposure to infections with a predominantly sexual mode of transmission: Secondary | ICD-10-CM | POA: Diagnosis not present

## 2022-06-09 DIAGNOSIS — Z202 Contact with and (suspected) exposure to infections with a predominantly sexual mode of transmission: Secondary | ICD-10-CM | POA: Diagnosis not present

## 2022-09-30 DIAGNOSIS — F331 Major depressive disorder, recurrent, moderate: Secondary | ICD-10-CM | POA: Diagnosis not present

## 2022-09-30 DIAGNOSIS — F3181 Bipolar II disorder: Secondary | ICD-10-CM | POA: Diagnosis not present

## 2022-09-30 DIAGNOSIS — F4312 Post-traumatic stress disorder, chronic: Secondary | ICD-10-CM | POA: Diagnosis not present

## 2022-09-30 DIAGNOSIS — F411 Generalized anxiety disorder: Secondary | ICD-10-CM | POA: Diagnosis not present
